# Patient Record
Sex: Female | Born: 2000
Health system: Southern US, Community
[De-identification: ages and names within clinical notes are randomized; demographics above are authoritative.]

## PROBLEM LIST (undated history)

## (undated) DIAGNOSIS — C801 Malignant (primary) neoplasm, unspecified: Secondary | ICD-10-CM

## (undated) DIAGNOSIS — F32A Depression, unspecified: Secondary | ICD-10-CM

## (undated) DIAGNOSIS — F419 Anxiety disorder, unspecified: Secondary | ICD-10-CM

## (undated) DIAGNOSIS — T7840XA Allergy, unspecified, initial encounter: Secondary | ICD-10-CM

## (undated) DIAGNOSIS — K219 Gastro-esophageal reflux disease without esophagitis: Secondary | ICD-10-CM

## (undated) HISTORY — PX: TONSILLECTOMY: SUR1361

## (undated) HISTORY — PX: WISDOM TOOTH EXTRACTION: SHX21

## (undated) HISTORY — DX: Allergy, unspecified, initial encounter: T78.40XA

---
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
## 2018-01-17 DIAGNOSIS — R062 Wheezing: Secondary | ICD-10-CM | POA: Diagnosis not present

## 2018-01-17 DIAGNOSIS — L501 Idiopathic urticaria: Secondary | ICD-10-CM | POA: Diagnosis not present

## 2018-01-17 DIAGNOSIS — J3089 Other allergic rhinitis: Secondary | ICD-10-CM | POA: Diagnosis not present

## 2018-01-17 DIAGNOSIS — J301 Allergic rhinitis due to pollen: Secondary | ICD-10-CM | POA: Diagnosis not present

## 2018-01-24 DIAGNOSIS — J3081 Allergic rhinitis due to animal (cat) (dog) hair and dander: Secondary | ICD-10-CM | POA: Diagnosis not present

## 2018-01-24 DIAGNOSIS — J301 Allergic rhinitis due to pollen: Secondary | ICD-10-CM | POA: Diagnosis not present

## 2018-01-25 DIAGNOSIS — J3089 Other allergic rhinitis: Secondary | ICD-10-CM | POA: Diagnosis not present

## 2018-02-13 DIAGNOSIS — J3089 Other allergic rhinitis: Secondary | ICD-10-CM | POA: Diagnosis not present

## 2018-02-13 DIAGNOSIS — J3081 Allergic rhinitis due to animal (cat) (dog) hair and dander: Secondary | ICD-10-CM | POA: Diagnosis not present

## 2018-02-13 DIAGNOSIS — J301 Allergic rhinitis due to pollen: Secondary | ICD-10-CM | POA: Diagnosis not present

## 2018-02-15 DIAGNOSIS — J3081 Allergic rhinitis due to animal (cat) (dog) hair and dander: Secondary | ICD-10-CM | POA: Diagnosis not present

## 2018-02-15 DIAGNOSIS — J3089 Other allergic rhinitis: Secondary | ICD-10-CM | POA: Diagnosis not present

## 2018-02-15 DIAGNOSIS — J301 Allergic rhinitis due to pollen: Secondary | ICD-10-CM | POA: Diagnosis not present

## 2018-03-20 DIAGNOSIS — J3089 Other allergic rhinitis: Secondary | ICD-10-CM | POA: Diagnosis not present

## 2018-03-20 DIAGNOSIS — Z91010 Allergy to peanuts: Secondary | ICD-10-CM | POA: Diagnosis not present

## 2018-03-20 DIAGNOSIS — J301 Allergic rhinitis due to pollen: Secondary | ICD-10-CM | POA: Diagnosis not present

## 2018-03-20 DIAGNOSIS — J3081 Allergic rhinitis due to animal (cat) (dog) hair and dander: Secondary | ICD-10-CM | POA: Diagnosis not present

## 2018-03-21 DIAGNOSIS — J301 Allergic rhinitis due to pollen: Secondary | ICD-10-CM | POA: Diagnosis not present

## 2018-03-21 DIAGNOSIS — J3089 Other allergic rhinitis: Secondary | ICD-10-CM | POA: Diagnosis not present

## 2018-03-21 DIAGNOSIS — J3081 Allergic rhinitis due to animal (cat) (dog) hair and dander: Secondary | ICD-10-CM | POA: Diagnosis not present

## 2018-04-12 DIAGNOSIS — J3089 Other allergic rhinitis: Secondary | ICD-10-CM | POA: Diagnosis not present

## 2018-04-12 DIAGNOSIS — J3081 Allergic rhinitis due to animal (cat) (dog) hair and dander: Secondary | ICD-10-CM | POA: Diagnosis not present

## 2018-04-12 DIAGNOSIS — J301 Allergic rhinitis due to pollen: Secondary | ICD-10-CM | POA: Diagnosis not present

## 2018-04-17 DIAGNOSIS — J301 Allergic rhinitis due to pollen: Secondary | ICD-10-CM | POA: Diagnosis not present

## 2018-04-17 DIAGNOSIS — J3081 Allergic rhinitis due to animal (cat) (dog) hair and dander: Secondary | ICD-10-CM | POA: Diagnosis not present

## 2018-04-17 DIAGNOSIS — J3089 Other allergic rhinitis: Secondary | ICD-10-CM | POA: Diagnosis not present

## 2018-04-19 DIAGNOSIS — J3081 Allergic rhinitis due to animal (cat) (dog) hair and dander: Secondary | ICD-10-CM | POA: Diagnosis not present

## 2018-04-19 DIAGNOSIS — J3089 Other allergic rhinitis: Secondary | ICD-10-CM | POA: Diagnosis not present

## 2018-04-19 DIAGNOSIS — J301 Allergic rhinitis due to pollen: Secondary | ICD-10-CM | POA: Diagnosis not present

## 2018-04-20 DIAGNOSIS — Z8619 Personal history of other infectious and parasitic diseases: Secondary | ICD-10-CM | POA: Diagnosis not present

## 2018-04-20 DIAGNOSIS — Z30015 Encounter for initial prescription of vaginal ring hormonal contraceptive: Secondary | ICD-10-CM | POA: Diagnosis not present

## 2018-04-20 DIAGNOSIS — N920 Excessive and frequent menstruation with regular cycle: Secondary | ICD-10-CM | POA: Diagnosis not present

## 2018-04-20 DIAGNOSIS — Z113 Encounter for screening for infections with a predominantly sexual mode of transmission: Secondary | ICD-10-CM | POA: Diagnosis not present

## 2018-04-26 DIAGNOSIS — J3081 Allergic rhinitis due to animal (cat) (dog) hair and dander: Secondary | ICD-10-CM | POA: Diagnosis not present

## 2018-04-26 DIAGNOSIS — J301 Allergic rhinitis due to pollen: Secondary | ICD-10-CM | POA: Diagnosis not present

## 2018-04-26 DIAGNOSIS — J3089 Other allergic rhinitis: Secondary | ICD-10-CM | POA: Diagnosis not present

## 2018-05-03 DIAGNOSIS — J301 Allergic rhinitis due to pollen: Secondary | ICD-10-CM | POA: Diagnosis not present

## 2018-05-03 DIAGNOSIS — J3089 Other allergic rhinitis: Secondary | ICD-10-CM | POA: Diagnosis not present

## 2018-05-03 DIAGNOSIS — J3081 Allergic rhinitis due to animal (cat) (dog) hair and dander: Secondary | ICD-10-CM | POA: Diagnosis not present

## 2018-05-10 DIAGNOSIS — J3089 Other allergic rhinitis: Secondary | ICD-10-CM | POA: Diagnosis not present

## 2018-05-10 DIAGNOSIS — J301 Allergic rhinitis due to pollen: Secondary | ICD-10-CM | POA: Diagnosis not present

## 2018-05-10 DIAGNOSIS — J3081 Allergic rhinitis due to animal (cat) (dog) hair and dander: Secondary | ICD-10-CM | POA: Diagnosis not present

## 2018-05-22 DIAGNOSIS — T7801XD Anaphylactic reaction due to peanuts, subsequent encounter: Secondary | ICD-10-CM | POA: Diagnosis not present

## 2018-05-22 DIAGNOSIS — J3089 Other allergic rhinitis: Secondary | ICD-10-CM | POA: Diagnosis not present

## 2018-05-22 DIAGNOSIS — J301 Allergic rhinitis due to pollen: Secondary | ICD-10-CM | POA: Diagnosis not present

## 2018-05-22 DIAGNOSIS — T7805XD Anaphylactic reaction due to tree nuts and seeds, subsequent encounter: Secondary | ICD-10-CM | POA: Diagnosis not present

## 2018-05-22 DIAGNOSIS — R05 Cough: Secondary | ICD-10-CM | POA: Diagnosis not present

## 2018-05-29 DIAGNOSIS — J301 Allergic rhinitis due to pollen: Secondary | ICD-10-CM | POA: Diagnosis not present

## 2018-05-29 DIAGNOSIS — J3089 Other allergic rhinitis: Secondary | ICD-10-CM | POA: Diagnosis not present

## 2018-05-29 DIAGNOSIS — J3081 Allergic rhinitis due to animal (cat) (dog) hair and dander: Secondary | ICD-10-CM | POA: Diagnosis not present

## 2018-05-31 DIAGNOSIS — R112 Nausea with vomiting, unspecified: Secondary | ICD-10-CM | POA: Diagnosis not present

## 2018-05-31 DIAGNOSIS — J069 Acute upper respiratory infection, unspecified: Secondary | ICD-10-CM | POA: Diagnosis not present

## 2018-06-22 DIAGNOSIS — N644 Mastodynia: Secondary | ICD-10-CM | POA: Diagnosis not present

## 2018-06-22 DIAGNOSIS — N6452 Nipple discharge: Secondary | ICD-10-CM | POA: Diagnosis not present

## 2018-07-02 DIAGNOSIS — R112 Nausea with vomiting, unspecified: Secondary | ICD-10-CM | POA: Diagnosis not present

## 2018-07-02 DIAGNOSIS — N6452 Nipple discharge: Secondary | ICD-10-CM | POA: Diagnosis not present

## 2018-07-02 DIAGNOSIS — R634 Abnormal weight loss: Secondary | ICD-10-CM | POA: Diagnosis not present

## 2018-07-03 ENCOUNTER — Other Ambulatory Visit: Payer: Self-pay | Admitting: Physician Assistant

## 2018-07-03 DIAGNOSIS — R110 Nausea: Secondary | ICD-10-CM

## 2018-07-03 DIAGNOSIS — R112 Nausea with vomiting, unspecified: Secondary | ICD-10-CM

## 2018-07-04 ENCOUNTER — Other Ambulatory Visit: Payer: Self-pay

## 2018-07-05 ENCOUNTER — Ambulatory Visit
Admission: RE | Admit: 2018-07-05 | Discharge: 2018-07-05 | Disposition: A | Discharge status: Home / Self Care | Payer: BLUE CROSS/BLUE SHIELD | Source: Ambulatory Visit | Attending: Physician Assistant | Admitting: Physician Assistant

## 2018-07-05 DIAGNOSIS — R112 Nausea with vomiting, unspecified: Secondary | ICD-10-CM

## 2018-07-05 DIAGNOSIS — R110 Nausea: Secondary | ICD-10-CM

## 2018-07-17 DIAGNOSIS — N6452 Nipple discharge: Secondary | ICD-10-CM | POA: Diagnosis not present

## 2018-07-17 DIAGNOSIS — N644 Mastodynia: Secondary | ICD-10-CM | POA: Diagnosis not present

## 2018-10-10 DIAGNOSIS — Z113 Encounter for screening for infections with a predominantly sexual mode of transmission: Secondary | ICD-10-CM | POA: Diagnosis not present

## 2018-10-10 DIAGNOSIS — Z114 Encounter for screening for human immunodeficiency virus [HIV]: Secondary | ICD-10-CM | POA: Diagnosis not present

## 2018-12-20 DIAGNOSIS — J301 Allergic rhinitis due to pollen: Secondary | ICD-10-CM | POA: Diagnosis not present

## 2018-12-20 DIAGNOSIS — J3081 Allergic rhinitis due to animal (cat) (dog) hair and dander: Secondary | ICD-10-CM | POA: Diagnosis not present

## 2018-12-20 DIAGNOSIS — Z91010 Allergy to peanuts: Secondary | ICD-10-CM | POA: Diagnosis not present

## 2018-12-20 DIAGNOSIS — J3089 Other allergic rhinitis: Secondary | ICD-10-CM | POA: Diagnosis not present

## 2018-12-27 DIAGNOSIS — F430 Acute stress reaction: Secondary | ICD-10-CM | POA: Diagnosis not present

## 2020-02-06 ENCOUNTER — Encounter: Payer: Self-pay | Admitting: Emergency Medicine

## 2020-02-06 ENCOUNTER — Emergency Department
Admission: EM | Admit: 2020-02-06 | Discharge: 2020-02-06 | Disposition: A | Discharge status: Home / Self Care | Payer: BC Managed Care – PPO | Attending: Student | Admitting: Student

## 2020-02-06 ENCOUNTER — Other Ambulatory Visit: Payer: Self-pay

## 2020-02-06 ENCOUNTER — Emergency Department: Payer: BC Managed Care – PPO

## 2020-02-06 DIAGNOSIS — S3992XA Unspecified injury of lower back, initial encounter: Secondary | ICD-10-CM | POA: Diagnosis not present

## 2020-02-06 DIAGNOSIS — R519 Headache, unspecified: Secondary | ICD-10-CM | POA: Diagnosis not present

## 2020-02-06 DIAGNOSIS — R0789 Other chest pain: Secondary | ICD-10-CM | POA: Diagnosis not present

## 2020-02-06 DIAGNOSIS — Z79899 Other long term (current) drug therapy: Secondary | ICD-10-CM | POA: Diagnosis not present

## 2020-02-06 DIAGNOSIS — R1011 Right upper quadrant pain: Secondary | ICD-10-CM | POA: Diagnosis not present

## 2020-02-06 DIAGNOSIS — R109 Unspecified abdominal pain: Secondary | ICD-10-CM | POA: Diagnosis not present

## 2020-02-06 DIAGNOSIS — S299XXA Unspecified injury of thorax, initial encounter: Secondary | ICD-10-CM | POA: Diagnosis not present

## 2020-02-06 DIAGNOSIS — R1031 Right lower quadrant pain: Secondary | ICD-10-CM | POA: Diagnosis not present

## 2020-02-06 DIAGNOSIS — S199XXA Unspecified injury of neck, initial encounter: Secondary | ICD-10-CM | POA: Diagnosis not present

## 2020-02-06 DIAGNOSIS — S3991XA Unspecified injury of abdomen, initial encounter: Secondary | ICD-10-CM | POA: Diagnosis not present

## 2020-02-06 DIAGNOSIS — R110 Nausea: Secondary | ICD-10-CM | POA: Diagnosis not present

## 2020-02-06 DIAGNOSIS — S0990XA Unspecified injury of head, initial encounter: Secondary | ICD-10-CM | POA: Diagnosis not present

## 2020-02-06 DIAGNOSIS — M546 Pain in thoracic spine: Secondary | ICD-10-CM | POA: Diagnosis not present

## 2020-02-06 LAB — URINALYSIS, COMPLETE (UACMP) WITH MICROSCOPIC
Bacteria, UA: NONE SEEN
Bilirubin Urine: NEGATIVE
Glucose, UA: NEGATIVE mg/dL
Hgb urine dipstick: NEGATIVE
Ketones, ur: 5 mg/dL — AB
Leukocytes,Ua: NEGATIVE
Nitrite: NEGATIVE
Protein, ur: NEGATIVE mg/dL
Specific Gravity, Urine: 1.025 (ref 1.005–1.030)
pH: 6 (ref 5.0–8.0)

## 2020-02-06 LAB — COMPREHENSIVE METABOLIC PANEL
ALT: 13 U/L (ref 0–44)
AST: 16 U/L (ref 15–41)
Albumin: 4.3 g/dL (ref 3.5–5.0)
Alkaline Phosphatase: 47 U/L (ref 38–126)
Anion gap: 7 (ref 5–15)
BUN: 13 mg/dL (ref 6–20)
CO2: 25 mmol/L (ref 22–32)
Calcium: 9.1 mg/dL (ref 8.9–10.3)
Chloride: 104 mmol/L (ref 98–111)
Creatinine, Ser: 0.71 mg/dL (ref 0.44–1.00)
GFR calc Af Amer: >60 mL/min (ref >60)
GFR calc non Af Amer: >60 mL/min (ref >60)
Glucose, Bld: 99 mg/dL (ref 70–99)
Potassium: 4.1 mmol/L (ref 3.5–5.1)
Sodium: 136 mmol/L (ref 135–145)
Total Bilirubin: 1 mg/dL (ref 0.3–1.2)
Total Protein: 7.6 g/dL (ref 6.5–8.1)

## 2020-02-06 LAB — CBC WITH DIFFERENTIAL/PLATELET
Abs Immature Granulocytes: 0.01 10*3/uL (ref 0.00–0.07)
Basophils Absolute: 0 10*3/uL (ref 0.0–0.1)
Basophils Relative: 0 %
Eosinophils Absolute: 0.2 10*3/uL (ref 0.0–0.5)
Eosinophils Relative: 5 %
HCT: 42.3 % (ref 36.0–46.0)
Hemoglobin: 14.1 g/dL (ref 12.0–15.0)
Immature Granulocytes: 0 %
Lymphocytes Relative: 57 %
Lymphs Abs: 2.6 10*3/uL (ref 0.7–4.0)
MCH: 32.9 pg (ref 26.0–34.0)
MCHC: 33.3 g/dL (ref 30.0–36.0)
MCV: 98.8 fL (ref 80.0–100.0)
Monocytes Absolute: 0.4 10*3/uL (ref 0.1–1.0)
Monocytes Relative: 8 %
Neutro Abs: 1.4 10*3/uL — ABNORMAL LOW (ref 1.7–7.7)
Neutrophils Relative %: 30 %
Platelets: 203 10*3/uL (ref 150–400)
RBC: 4.28 MIL/uL (ref 3.87–5.11)
RDW: 12.5 % (ref 11.5–15.5)
WBC: 4.7 10*3/uL (ref 4.0–10.5)
nRBC: 0 % (ref 0.0–0.2)

## 2020-02-06 LAB — TYPE AND SCREEN
ABO/RH(D): O NEG
Antibody Screen: NEGATIVE

## 2020-02-06 LAB — PREGNANCY, URINE: Preg Test, Ur: NEGATIVE
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
## 2020-02-06 MED ORDER — KETOROLAC TROMETHAMINE 30 MG/ML IJ SOLN
15.0000 mg | Freq: Once | INTRAMUSCULAR | Status: AC
  Administered 2020-02-06: 15 mg via INTRAVENOUS
  Filled 2020-02-06: qty 1

## 2020-02-06 MED ORDER — IOHEXOL 300 MG/ML  SOLN
100.0000 mL | Freq: Once | INTRAMUSCULAR | Status: AC | PRN
  Administered 2020-02-06: 100 mL via INTRAVENOUS

## 2020-02-06 MED ORDER — ONDANSETRON HCL 4 MG/2ML IJ SOLN
4.0000 mg | Freq: Once | INTRAMUSCULAR | Status: AC
  Administered 2020-02-06: 4 mg via INTRAVENOUS
  Filled 2020-02-06: qty 2

## 2020-02-06 MED ORDER — MORPHINE SULFATE (PF) 2 MG/ML IV SOLN
2.0000 mg | Freq: Once | INTRAVENOUS | Status: AC
  Administered 2020-02-06: 2 mg via INTRAVENOUS
  Filled 2020-02-06: qty 1

## 2020-02-06 NOTE — Discharge Instructions (Addendum)
Thank you for letting us take care of you in the emergency department today.  Your imaging did not show any fractures or injuries.  You can expect to be stiff and sore for the next several days.  Take over-the-counter ibuprofen and Tylenol as directed on the box to help with this.  Otherwise, please follow-up with your regular doctor to review your ER visit and follow-up on your symptoms.  Return to the emergency department for any new or worsening symptoms.

## 2020-02-06 NOTE — ED Notes (Signed)
Pt provided verbal consent for DC, topaz pad broken.

## 2020-02-06 NOTE — ED Notes (Signed)
Pt c/o pain 8/10 in her head and abdomen. EDP aware.
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
## 2020-02-15 DIAGNOSIS — Z20828 Contact with and (suspected) exposure to other viral communicable diseases: Secondary | ICD-10-CM | POA: Diagnosis not present

## 2020-02-15 DIAGNOSIS — R430 Anosmia: Secondary | ICD-10-CM | POA: Diagnosis not present

## 2020-02-15 DIAGNOSIS — R432 Parageusia: Secondary | ICD-10-CM | POA: Diagnosis not present

## 2020-02-15 DIAGNOSIS — J309 Allergic rhinitis, unspecified: Secondary | ICD-10-CM | POA: Diagnosis not present

## 2020-02-18 ENCOUNTER — Ambulatory Visit: Payer: BC Managed Care – PPO | Admitting: Family Medicine

## 2020-02-18 ENCOUNTER — Other Ambulatory Visit: Payer: Self-pay

## 2020-02-18 ENCOUNTER — Ambulatory Visit (INDEPENDENT_AMBULATORY_CARE_PROVIDER_SITE_OTHER): Payer: BC Managed Care – PPO | Admitting: Family Medicine

## 2020-02-18 ENCOUNTER — Encounter: Payer: Self-pay | Admitting: Family Medicine

## 2020-02-18 ENCOUNTER — Telehealth: Payer: Self-pay

## 2020-02-18 VITALS — BP 110/74 | HR 93 | Temp 98.2°F | Ht 62.0 in | Wt 146.0 lb

## 2020-02-18 DIAGNOSIS — J3089 Other allergic rhinitis: Secondary | ICD-10-CM

## 2020-02-18 DIAGNOSIS — J309 Allergic rhinitis, unspecified: Secondary | ICD-10-CM | POA: Insufficient documentation

## 2020-02-18 DIAGNOSIS — Z7689 Persons encountering health services in other specified circumstances: Secondary | ICD-10-CM | POA: Diagnosis not present

## 2020-02-18 DIAGNOSIS — R109 Unspecified abdominal pain: Secondary | ICD-10-CM

## 2020-02-18 MED ORDER — ALBUTEROL SULFATE HFA 108 (90 BASE) MCG/ACT IN AERS
2.0000 | INHALATION_SPRAY | Freq: Four times a day (QID) | RESPIRATORY_TRACT | 0 refills | Status: DC | PRN
Start: 2020-02-18 — End: 2020-03-22

## 2020-02-18 NOTE — Assessment & Plan Note (Signed)
With acute exacerbation, pt opting to take no medications for control. Discussed sinus rinses, essential oils, supplements/vitamins. Is agreeable to albuterol inhaler for prn use for cough, occasional chest tightness
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
## 2020-02-18 NOTE — Telephone Encounter (Signed)
PA for the Albuterol inhaler initiated and submitted via Cover My Meds. Key: [id number]

## 2020-02-21 NOTE — Telephone Encounter (Signed)
PA denied.

## 2020-02-26 DIAGNOSIS — J3081 Allergic rhinitis due to animal (cat) (dog) hair and dander: Secondary | ICD-10-CM | POA: Diagnosis not present

## 2020-02-26 DIAGNOSIS — J3089 Other allergic rhinitis: Secondary | ICD-10-CM | POA: Diagnosis not present

## 2020-02-26 DIAGNOSIS — Z91010 Allergy to peanuts: Secondary | ICD-10-CM | POA: Diagnosis not present

## 2020-02-26 DIAGNOSIS — J301 Allergic rhinitis due to pollen: Secondary | ICD-10-CM | POA: Diagnosis not present

## 2020-03-11 ENCOUNTER — Telehealth (INDEPENDENT_AMBULATORY_CARE_PROVIDER_SITE_OTHER): Payer: BC Managed Care – PPO | Admitting: Family Medicine

## 2020-03-11 ENCOUNTER — Encounter: Payer: Self-pay | Admitting: Family Medicine

## 2020-03-11 VITALS — BP 118/79 | HR 96 | Wt 140.0 lb

## 2020-03-11 DIAGNOSIS — J3089 Other allergic rhinitis: Secondary | ICD-10-CM | POA: Diagnosis not present

## 2020-03-11 NOTE — Assessment & Plan Note (Addendum)
Recommended antihistamines, flonase BID, sinus rinses, mucinex prn. If no improvement, may need prednisone taper. States she has the antihistamines and nasal spray at home from her Allergist previously and will restart them

## 2020-03-11 NOTE — Progress Notes (Signed)
BP 118/79    Pulse 96    Wt 140 lb (63.5 kg)    BMI 25.61 kg/m    Subjective:    Patient ID: Lauren Cook, female    DOB: [birth year], 19 y.o.   MRN: [id number]  HPI: Lauren Cook is a 19 y.o. female  Chief Complaint  Patient presents with   Sinusitis    pt states that she has not gotten any better since her last visit on 02/18/20   Cough   Epistaxis     This visit was completed via MyChart due to the restrictions of the COVID-19 pandemic. All issues as above were discussed and addressed. Physical exam was done as above through visual confirmation on MyChart. If it was felt that the patient should be evaluated in the office, they were directed there. The patient verbally consented to this visit.  Location of the patient: home  Location of the provider: work  Those involved with this call:   Provider: Roosvelt Maser, PA-C  CMA: Elton Sin, CMA  Front Desk/Registration: Harriet Pho   Time spent on call: 15 minutes with patient face to face via video conference. More than 50% of this time was spent in counseling and coordination of care. 5 minutes total spent in review of patient's record and preparation of their chart. I verified patient identity using two factors (patient name and date of birth). Patient consents verbally to being seen via telemedicine visit today.   Here today with ongoing sinus pressure, congestion, nosebleeds, sore swollen throat, and now productive cough. This has been ongoing for about a month now. Known hx of severe seasonal allergies but is trying to treat them naturally with essential oils, saline rinses rather than take medications. Denies fever, chills, CP, SOB. Does have an albuterol inhaler which she's taking prn with some relief of cough.   Relevant past medical, surgical, family and social history reviewed and updated as indicated. Interim medical history since our last visit reviewed. Allergies and medications reviewed and  updated.  Review of Systems  Per HPI unless specifically indicated above     Objective:    BP 118/79    Pulse 96    Wt 140 lb (63.5 kg)    BMI 25.61 kg/m   Wt Readings from Last 3 Encounters:  03/11/20 140 lb (63.5 kg)  02/18/20 146 lb (66.2 kg)  02/06/20 171 lb 8.3 oz (77.8 kg)    Physical Exam Vitals and nursing note reviewed.  Constitutional:      General: She is not in acute distress.    Appearance: Normal appearance.  HENT:     Head: Atraumatic.     Right Ear: External ear normal.     Left Ear: External ear normal.     Nose: Congestion present.     Mouth/Throat:     Mouth: Mucous membranes are moist.     Pharynx: Oropharynx is clear. Posterior oropharyngeal erythema present.  Eyes:     Extraocular Movements: Extraocular movements intact.     Conjunctiva/sclera: Conjunctivae normal.  Cardiovascular:     Comments: Unable to assess via virtual visit Pulmonary:     Effort: Pulmonary effort is normal. No respiratory distress.  Musculoskeletal:        General: Normal range of motion.     Cervical back: Normal range of motion.  Skin:    General: Skin is dry.     Findings: No erythema.  Neurological:     Mental Status: She is  alert and oriented to person, place, and time.  Psychiatric:        Mood and Affect: Mood normal.        Thought Content: Thought content normal.        Judgment: Judgment normal.     Results for orders placed or performed during the hospital encounter of 02/06/20  CBC with Differential  Result Value Ref Range   WBC 4.7 4.0 - 10.5 K/uL   RBC 4.28 3.87 - 5.11 MIL/uL   Hemoglobin 14.1 12.0 - 15.0 g/dL   HCT 61.9 36 - 46 %   MCV 98.8 80.0 - 100.0 fL   MCH 32.9 26.0 - 34.0 pg   MCHC 33.3 30.0 - 36.0 g/dL   RDW 50.9 32.6 - 71.2 %   Platelets 203 150 - 400 K/uL   nRBC 0.0 0.0 - 0.2 %   Neutrophils Relative % 30 %   Neutro Abs 1.4 (L) 1.7 - 7.7 K/uL   Lymphocytes Relative 57 %   Lymphs Abs 2.6 0.7 - 4.0 K/uL   Monocytes Relative 8 %    Monocytes Absolute 0.4 0 - 1 K/uL   Eosinophils Relative 5 %   Eosinophils Absolute 0.2 0 - 0 K/uL   Basophils Relative 0 %   Basophils Absolute 0.0 0 - 0 K/uL   Immature Granulocytes 0 %   Abs Immature Granulocytes 0.01 0.00 - 0.07 K/uL  Comprehensive metabolic panel  Result Value Ref Range   Sodium 136 135 - 145 mmol/L   Potassium 4.1 3.5 - 5.1 mmol/L   Chloride 104 98 - 111 mmol/L   CO2 25 22 - 32 mmol/L   Glucose, Bld 99 70 - 99 mg/dL   BUN 13 6 - 20 mg/dL   Creatinine, Ser 4.58 0.44 - 1.00 mg/dL   Calcium 9.1 8.9 - 09.9 mg/dL   Total Protein 7.6 6.5 - 8.1 g/dL   Albumin 4.3 3.5 - 5.0 g/dL   AST 16 15 - 41 U/L   ALT 13 0 - 44 U/L   Alkaline Phosphatase 47 38 - 126 U/L   Total Bilirubin 1.0 0.3 - 1.2 mg/dL   GFR calc non Af Amer >60 >60 mL/min   GFR calc Af Amer >60 >60 mL/min   Anion gap 7 5 - 15  Pregnancy, urine  Result Value Ref Range   Preg Test, Ur NEGATIVE NEGATIVE  Urinalysis, Complete w Microscopic  Result Value Ref Range   Color, Urine YELLOW (A) YELLOW   APPearance CLEAR (A) CLEAR   Specific Gravity, Urine 1.025 1.005 - 1.030   pH 6.0 5.0 - 8.0   Glucose, UA NEGATIVE NEGATIVE mg/dL   Hgb urine dipstick NEGATIVE NEGATIVE   Bilirubin Urine NEGATIVE NEGATIVE   Ketones, ur 5 (A) NEGATIVE mg/dL   Protein, ur NEGATIVE NEGATIVE mg/dL   Nitrite NEGATIVE NEGATIVE   Leukocytes,Ua NEGATIVE NEGATIVE   RBC / HPF 0-5 0 - 5 RBC/hpf   WBC, UA 0-5 0 - 5 WBC/hpf   Bacteria, UA NONE SEEN NONE SEEN   Squamous Epithelial / LPF 0-5 0 - 5   Mucus PRESENT   Type and screen [facility]  Result Value Ref Range   ABO/RH(D) O NEG    Antibody Screen NEG    Sample Expiration      02/09/2020,2359 Performed at [facility], [address] [place], [place] [address]       Assessment & Plan:   Problem List Items Addressed This  Visit      Respiratory   Allergic rhinitis - Primary    Recommended antihistamines, flonase BID, sinus rinses,  mucinex prn. If no improvement, may need prednisone taper. States she has the antihistamines and nasal spray at home from her Allergist previously and will restart them          Follow up plan: Return if symptoms worsen or fail to improve.

## 2020-03-14 DIAGNOSIS — J0100 Acute maxillary sinusitis, unspecified: Secondary | ICD-10-CM | POA: Diagnosis not present

## 2020-03-18 DIAGNOSIS — N898 Other specified noninflammatory disorders of vagina: Secondary | ICD-10-CM | POA: Diagnosis not present

## 2020-03-22 ENCOUNTER — Other Ambulatory Visit: Payer: Self-pay | Admitting: Family Medicine

## 2020-03-22 NOTE — Telephone Encounter (Signed)
Requested Prescriptions  Pending Prescriptions Disp Refills   VENTOLIN HFA 108 (90 Base) MCG/ACT inhaler [Pharmacy Med Name: VENTOLIN HFA 90 MCG INHALER] 18 g 0    Sig: TAKE 2 PUFFS BY MOUTH EVERY 6 HOURS AS NEEDED FOR WHEEZE OR SHORTNESS OF BREATH     Pulmonology:  Beta Agonists Failed - 03/22/2020 12:57 AM      Failed - One inhaler should last at least one month. If the patient is requesting refills earlier, contact the patient to check for uncontrolled symptoms.      Passed - Valid encounter within last 12 months    Recent Outpatient Visits          1 week ago Non-seasonal allergic rhinitis, unspecified trigger   [facility] Particia Nearing, [place]   1 month ago Non-seasonal allergic rhinitis, unspecified trigger   [facility] Roosvelt Maser [place], [place]

## 2020-04-08 ENCOUNTER — Other Ambulatory Visit: Payer: Self-pay

## 2020-04-08 ENCOUNTER — Ambulatory Visit: Payer: BC Managed Care – PPO | Admitting: Family Medicine

## 2020-04-08 ENCOUNTER — Encounter: Payer: Self-pay | Admitting: Family Medicine

## 2020-04-08 VITALS — BP 118/83 | HR 111 | Temp 99.5°F | Wt 133.0 lb

## 2020-04-08 DIAGNOSIS — J029 Acute pharyngitis, unspecified: Secondary | ICD-10-CM

## 2020-04-08 MED ORDER — AMOXICILLIN-POT CLAVULANATE 875-125 MG PO TABS: 1.0000 | ORAL_TABLET | Freq: Two times a day (BID) | ORAL | 0 refills | Status: DC

## 2020-04-08 NOTE — Progress Notes (Signed)
BP 118/83   Pulse (!) 111   Temp 99.5 F (37.5 C) (Oral)   Wt 133 lb (60.3 kg)   SpO2 97%   BMI 24.33 kg/m    Subjective:    Patient ID: Lauren Cook, female    DOB: Feb 09, 2001, 19 y.o.   MRN: [id number]  HPI: Lauren Cook is a 19 y.o. female  Chief Complaint  Patient presents with  . Ear Pain    bilateral since this morning  . Throat pain  . Headache   Here today with 1 day of swollen, painful sore throat worst on the right, hoarseness, headache, b/l ear pain, nausea. Denies congestion, rhinorrhea, cough, wheezing, fever, chills, SOB. No sick contacts. Notes she's been taking some old leftover amoxil the past 2 days without much benefit. Hx of allergic rhinitis, states currently good control without sxs off medications.   Relevant past medical, surgical, family and social history reviewed and updated as indicated. Interim medical history since our last visit reviewed. Allergies and medications reviewed and updated.  Review of Systems  Per HPI unless specifically indicated above     Objective:    BP 118/83   Pulse (!) 111   Temp 99.5 F (37.5 C) (Oral)   Wt 133 lb (60.3 kg)   SpO2 97%   BMI 24.33 kg/m   Wt Readings from Last 3 Encounters:  04/08/20 133 lb (60.3 kg)  03/11/20 140 lb (63.5 kg)  02/18/20 146 lb (66.2 kg)    Physical Exam Vitals and nursing note reviewed.  Constitutional:      Appearance: Normal appearance. She is not ill-appearing.  HENT:     Head: Atraumatic.     Right Ear: Tympanic membrane normal.     Left Ear: Tympanic membrane normal.     Nose: Nose normal.     Mouth/Throat:     Mouth: Mucous membranes are moist.     Pharynx: Oropharyngeal exudate (right greater than left tonsil) and posterior oropharyngeal erythema present.  Eyes:     Extraocular Movements: Extraocular movements intact.     Conjunctiva/sclera: Conjunctivae normal.  Cardiovascular:     Rate and Rhythm: Normal rate and regular rhythm.     Heart sounds: Normal  heart sounds.  Pulmonary:     Effort: Pulmonary effort is normal.     Breath sounds: Normal breath sounds.  Abdominal:     General: Bowel sounds are normal.     Palpations: Abdomen is soft.     Tenderness: There is no abdominal tenderness.  Musculoskeletal:        General: Normal range of motion.     Cervical back: Normal range of motion and neck supple.  Lymphadenopathy:     Cervical: Cervical adenopathy (right focal) present.  Skin:    General: Skin is warm and dry.  Neurological:     Mental Status: She is alert and oriented to person, place, and time.  Psychiatric:        Mood and Affect: Mood normal.        Thought Content: Thought content normal.        Judgment: Judgment normal.     Results for orders placed or performed during the hospital encounter of 02/06/20  CBC with Differential  Result Value Ref Range   WBC 4.7 4.0 - 10.5 K/uL   RBC 4.28 3.87 - 5.11 MIL/uL   Hemoglobin 14.1 12.0 - 15.0 g/dL   HCT 56.4 36 - 46 %   MCV 98.8  80.0 - 100.0 fL   MCH 32.9 26.0 - 34.0 pg   MCHC 33.3 30.0 - 36.0 g/dL   RDW 38.2 50.5 - 39.7 %   Platelets 203 150 - 400 K/uL   nRBC 0.0 0.0 - 0.2 %   Neutrophils Relative % 30 %   Neutro Abs 1.4 (L) 1.7 - 7.7 K/uL   Lymphocytes Relative 57 %   Lymphs Abs 2.6 0.7 - 4.0 K/uL   Monocytes Relative 8 %   Monocytes Absolute 0.4 0 - 1 K/uL   Eosinophils Relative 5 %   Eosinophils Absolute 0.2 0 - 0 K/uL   Basophils Relative 0 %   Basophils Absolute 0.0 0 - 0 K/uL   Immature Granulocytes 0 %   Abs Immature Granulocytes 0.01 0.00 - 0.07 K/uL  Comprehensive metabolic panel  Result Value Ref Range   Sodium 136 135 - 145 mmol/L   Potassium 4.1 3.5 - 5.1 mmol/L   Chloride 104 98 - 111 mmol/L   CO2 25 22 - 32 mmol/L   Glucose, Bld 99 70 - 99 mg/dL   BUN 13 6 - 20 mg/dL   Creatinine, Ser 6.73 0.44 - 1.00 mg/dL   Calcium 9.1 8.9 - 41.9 mg/dL   Total Protein 7.6 6.5 - 8.1 g/dL   Albumin 4.3 3.5 - 5.0 g/dL   AST 16 15 - 41 U/L   ALT 13 0 -  44 U/L   Alkaline Phosphatase 47 38 - 126 U/L   Total Bilirubin 1.0 0.3 - 1.2 mg/dL   GFR calc non Af Amer >60 >60 mL/min   GFR calc Af Amer >60 >60 mL/min   Anion gap 7 5 - 15  Pregnancy, urine  Result Value Ref Range   Preg Test, Ur NEGATIVE NEGATIVE  Urinalysis, Complete w Microscopic  Result Value Ref Range   Color, Urine YELLOW (A) YELLOW   APPearance CLEAR (A) CLEAR   Specific Gravity, Urine 1.025 1.005 - 1.030   pH 6.0 5.0 - 8.0   Glucose, UA NEGATIVE NEGATIVE mg/dL   Hgb urine dipstick NEGATIVE NEGATIVE   Bilirubin Urine NEGATIVE NEGATIVE   Ketones, ur 5 (A) NEGATIVE mg/dL   Protein, ur NEGATIVE NEGATIVE mg/dL   Nitrite NEGATIVE NEGATIVE   Leukocytes,Ua NEGATIVE NEGATIVE   RBC / HPF 0-5 0 - 5 RBC/hpf   WBC, UA 0-5 0 - 5 WBC/hpf   Bacteria, UA NONE SEEN NONE SEEN   Squamous Epithelial / LPF 0-5 0 - 5   Mucus PRESENT   Type and screen [facility]  Result Value Ref Range   ABO/RH(D) O NEG    Antibody Screen NEG    Sample Expiration      02/09/2020,2359 Performed at [facility], [address], [place], [place] [address]       Assessment & Plan:   Problem List Items Addressed This Visit    None    Visit Diagnoses    Pharyngitis, unspecified etiology    -  Primary   Suspect bacterial, will tx with augmentin, salt water gargles and other supportive care, remain home until sxs resolve. Declines work note       Follow up plan: Return if symptoms worsen or fail to improve.

## 2020-04-14 ENCOUNTER — Encounter: Payer: Self-pay | Admitting: Family Medicine

## 2020-05-07 DIAGNOSIS — N760 Acute vaginitis: Secondary | ICD-10-CM | POA: Diagnosis not present

## 2020-05-12 ENCOUNTER — Ambulatory Visit: Payer: BC Managed Care – PPO | Admitting: Family Medicine

## 2020-05-15 ENCOUNTER — Ambulatory Visit (INDEPENDENT_AMBULATORY_CARE_PROVIDER_SITE_OTHER): Payer: BC Managed Care – PPO | Admitting: Family Medicine

## 2020-05-15 ENCOUNTER — Other Ambulatory Visit: Payer: Self-pay

## 2020-05-15 ENCOUNTER — Encounter: Payer: Self-pay | Admitting: Family Medicine

## 2020-05-15 VITALS — BP 102/68 | HR 104 | Temp 99.1°F | Ht 63.0 in | Wt 141.0 lb

## 2020-05-15 DIAGNOSIS — Z136 Encounter for screening for cardiovascular disorders: Secondary | ICD-10-CM | POA: Diagnosis not present

## 2020-05-15 DIAGNOSIS — Z0000 Encounter for general adult medical examination without abnormal findings: Secondary | ICD-10-CM | POA: Diagnosis not present

## 2020-05-15 LAB — UA/M W/RFLX CULTURE, ROUTINE
Bilirubin, UA: NEGATIVE
Glucose, UA: NEGATIVE
Ketones, UA: NEGATIVE
Leukocytes,UA: NEGATIVE
Nitrite, UA: NEGATIVE
Protein,UA: NEGATIVE
RBC, UA: NEGATIVE
Specific Gravity, UA: 1.025 (ref 1.005–1.030)
Urobilinogen, Ur: 0.2 mg/dL (ref 0.2–1.0)
pH, UA: 7 (ref 5.0–7.5)

## 2020-05-15 NOTE — Progress Notes (Signed)
BP 102/68   Pulse (!) 104   Temp 99.1 F (37.3 C) (Oral)   Ht 5\' 3"  (1.6 m)   Wt 141 lb (64 kg)   SpO2 97%   BMI 24.98 kg/m    Subjective:    Patient ID: , female    DOB: 04/10/2001, 19 y.o.   MRN: [id number]  HPI: Lauren Cook is a 19 y.o. female presenting on 05/15/2020 for comprehensive medical examination. 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 I did complete a risk assessment for falls. A plan of care for falls was documented.   Past Medical History:  Past Medical History:  Diagnosis Date  . Allergy     Surgical History:  Past Surgical History:  Procedure Laterality Date  . WISDOM TOOTH EXTRACTION Bilateral 07/2013    Medications:  No current outpatient medications on file prior to visit.   No current facility-administered medications on file prior to visit.    Allergies:  Allergies  Allergen Reactions  . Fish Allergy Hives  . Food Anaphylaxis and Other (See Comments)    Nuts  . Other Hives and Other (See Comments)    Raw vegetables  . Iodinated Diagnostic Agents Hives  . Nickel Hives  . Iodine Hives  . No Healthtouch Food Allergies Hives and Other (See Comments)    Citric Fruits  . Tramadol Nausea And Vomiting    Social History:  Social History   Socioeconomic History  . Marital status: Single    Spouse name: Not on file  . Number of children: Not on file  . Years of education: Not on file  .  Highest education level: Not on file  Occupational History  . Not on file  Tobacco Use  . Smoking status: Never Smoker  . Smokeless tobacco: Never Used  Vaping Use  . Vaping Use: Never used  Substance and Sexual Activity  . Alcohol use: No  . Drug use: No  . Sexual activity: Not Currently  Other Topics Concern  . Not on file  Social History Narrative  . Not on file   Social Determinants of Health   Financial Resource Strain:   . Difficulty of Paying Living Expenses: Not on file  Food Insecurity:   . Worried About 08/2013 in the Last Year: Not on file  . Ran Out of Food in the Last Year: Not on file  Transportation Needs:   . Lack of Transportation (Medical): Not on file  . Lack of Transportation (Non-Medical): Not on file  Physical Activity:   . Days of Exercise per Week: Not on file  . Minutes of Exercise per Session: Not on file  Stress:   . Feeling of Stress : Not on file  Social Connections:   . Frequency of Communication with Friends and Family: Not on file  . Frequency of Social Gatherings with Friends and Family: Not on  file  . Attends Religious Services: Not on file  . Active Member of Clubs or Organizations: Not on file  . Attends Banker Meetings: Not on file  . Marital Status: Not on file  Intimate Partner Violence:   . Fear of Current or Ex-Partner: Not on file  . Emotionally Abused: Not on file  . Physically Abused: Not on file  . Sexually Abused: Not on file   Social History   Tobacco Use  Smoking Status Never Smoker  Smokeless Tobacco Never Used   Social History   Substance and Sexual Activity  Alcohol Use No    Family History:  Family History  Problem Relation Age of Onset  . Breast cancer Mother     Past medical history, surgical history, medications, allergies, family history and social history reviewed with patient today and changes made to appropriate areas of the chart.   Review of Systems - General ROS:  negative Psychological ROS: negative Ophthalmic ROS: negative ENT ROS: negative Allergy and Immunology ROS: negative Hematological and Lymphatic ROS: negative Endocrine ROS: negative Breast ROS: negative for breast lumps Respiratory ROS: no cough, shortness of breath, or wheezing Cardiovascular ROS: no chest pain or dyspnea on exertion Gastrointestinal ROS: no abdominal pain, change in bowel habits, or black or bloody stools Genito-Urinary ROS: no dysuria, trouble voiding, or hematuria Musculoskeletal ROS: negative Neurological ROS: no TIA or stroke symptoms Dermatological ROS: negative All other ROS negative except what is listed above and in the HPI.      Objective:    BP 102/68   Pulse (!) 104   Temp 99.1 F (37.3 C) (Oral)   Ht 5\' 3"  (1.6 m)   Wt 141 lb (64 kg)   SpO2 97%   BMI 24.98 kg/m   Wt Readings from Last 3 Encounters:  05/15/20 141 lb (64 kg)  04/08/20 133 lb (60.3 kg)  03/11/20 140 lb (63.5 kg)    Physical Exam Vitals and nursing note reviewed.  Constitutional:      General: She is not in acute distress.    Appearance: She is well-developed.  HENT:     Head: Atraumatic.     Right Ear: External ear normal.     Left Ear: External ear normal.     Nose: Nose normal.     Mouth/Throat:     Pharynx: No oropharyngeal exudate.  Eyes:     General: No scleral icterus.    Conjunctiva/sclera: Conjunctivae normal.     Pupils: Pupils are equal, round, and reactive to light.  Neck:     Thyroid: No thyromegaly.  Cardiovascular:     Rate and Rhythm: Normal rate and regular rhythm.     Heart sounds: Normal heart sounds.  Pulmonary:     Effort: Pulmonary effort is normal. No respiratory distress.     Breath sounds: Normal breath sounds.  Chest:     Comments: Breast exam declined Abdominal:     General: Bowel sounds are normal.     Palpations: Abdomen is soft. There is no mass.     Tenderness: There is no abdominal tenderness.  Genitourinary:    Comments: GU  exam declined Musculoskeletal:        General: No tenderness. Normal range of motion.     Cervical back: Normal range of motion and neck supple.  Lymphadenopathy:     Cervical: No cervical adenopathy.  Skin:    General: Skin is warm and dry.     Findings: No rash.  Neurological:     Mental Status: She is alert and oriented to person, place, and time.     Cranial Nerves: No cranial nerve deficit.  Psychiatric:        Behavior: Behavior normal.     Results for orders placed or performed in visit on 05/15/20  CBC with Differential/Platelet  Result Value Ref Range   WBC 3.8 3.4 - 10.8 x10E3/uL   RBC 3.80 3.77 - 5.28 x10E6/uL   Hemoglobin 12.5 11.1 - 15.9 g/dL   Hematocrit 16.137.0 09.634.0 - 46.6 %   MCV 97 79 - 97 fL   MCH 32.9 26.6 - 33.0 pg   MCHC 33.8 31 - 35 g/dL   RDW 04.512.7 40.911.7 - 81.115.4 %   Platelets 216 150 - 450 x10E3/uL   Neutrophils 60 Not Estab. %   Lymphs 23 Not Estab. %   Monocytes 12 Not Estab. %   Eos 3 Not Estab. %   Basos 1 Not Estab. %   Neutrophils Absolute 2.3 1 - 7 x10E3/uL   Lymphocytes Absolute 0.9 0 - 3 x10E3/uL   Monocytes Absolute 0.5 0 - 0 x10E3/uL   EOS (ABSOLUTE) 0.1 0.0 - 0.4 x10E3/uL   Basophils Absolute 0.0 0 - 0 x10E3/uL   Immature Granulocytes 1 Not Estab. %   Immature Grans (Abs) 0.0 0.0 - 0.1 x10E3/uL  Comprehensive metabolic panel  Result Value Ref Range   Glucose 89 65 - 99 mg/dL   BUN 11 6 - 20 mg/dL   Creatinine, Ser 9.140.86 0.57 - 1.00 mg/dL   GFR calc non Af Amer 97 >59 mL/min/1.73   GFR calc Af Amer 112 >59 mL/min/1.73   BUN/Creatinine Ratio 13 9 - 23   Sodium 138 134 - 144 mmol/L   Potassium 4.2 3.5 - 5.2 mmol/L   Chloride 101 96 - 106 mmol/L   CO2 27 20 - 29 mmol/L   Calcium 9.2 8.7 - 10.2 mg/dL   Total Protein 6.6 6.0 - 8.5 g/dL   Albumin 4.0 3.9 - 5.0 g/dL   Globulin, Total 2.6 1.5 - 4.5 g/dL   Albumin/Globulin Ratio 1.5 1.2 - 2.2   Bilirubin Total 0.3 0.0 - 1.2 mg/dL   Alkaline Phosphatase 55 48 - 121 IU/L   AST 14 0 - 40 IU/L    ALT 11 0 - 32 IU/L  Lipid Panel w/o Chol/HDL Ratio  Result Value Ref Range   Cholesterol, Total 179 100 - 199 mg/dL   Triglycerides 73 0 - 149 mg/dL   HDL 69 >78>39 mg/dL   VLDL Cholesterol Cal 14 5 - 40 mg/dL   LDL Chol Calc (NIH) 96 0 - 99 mg/dL  TSH  Result Value Ref Range   TSH 0.320 (L) 0.450 - 4.500 uIU/mL  UA/M w/rflx Culture, Routine   Specimen: Urine   Urine  Result Value Ref Range   Specific Gravity, UA 1.025 1.005 - 1.030   pH, UA 7.0 5.0 - 7.5   Color, UA Yellow Yellow   Appearance Ur Clear Clear   Leukocytes,UA Negative Negative   Protein,UA Negative Negative/Trace   Glucose, UA Negative Negative   Ketones, UA Negative Negative   RBC, UA Negative Negative   Bilirubin, UA Negative Negative   Urobilinogen, Ur 0.2 0.2 - 1.0 mg/dL   Nitrite, UA Negative Negative      Assessment & Plan:   Problem List Items Addressed This Visit    None    Visit Diagnoses    Annual physical exam    -  Primary   Relevant Orders   CBC with Differential/Platelet (Completed)   Comprehensive metabolic panel (Completed)   Lipid Panel w/o Chol/HDL Ratio (Completed)   TSH (Completed)   UA/M w/rflx Culture, Routine (Completed)       Follow up plan: No follow-ups on file.   LABORATORY TESTING:  - Pap smear: up to date  IMMUNIZATIONS:   - Tdap: Tetanus vaccination status reviewed: unsure of date, will try to locate records. - Influenza: Postponed to flu season  PATIENT COUNSELING:   Advised to take 1 mg of folate supplement per day if capable of pregnancy.   Sexuality: Discussed sexually transmitted diseases, partner selection, use of condoms, avoidance of unintended pregnancy  and contraceptive alternatives.   Advised to avoid cigarette smoking.  I discussed with the patient that most people either abstain from alcohol or drink within safe limits (<=14/week and <=4 drinks/occasion for males, <=7/weeks and <= 3 drinks/occasion for females) and that the risk for alcohol  disorders and other health effects rises proportionally with the number of drinks per week and how often a drinker exceeds daily limits.  Discussed cessation/primary prevention of drug use and availability of treatment for abuse.   Diet: Encouraged to adjust caloric intake to maintain  or achieve ideal body weight, to reduce intake of dietary saturated fat and total fat, to limit sodium intake by avoiding high sodium foods and not adding table salt, and to maintain adequate dietary potassium and calcium preferably from fresh fruits, vegetables, and low-fat dairy products.    stressed the importance of regular exercise  Injury prevention: Discussed safety belts, safety helmets, smoke detector, smoking near bedding or upholstery.   Dental health: Discussed importance of regular tooth brushing, flossing, and dental visits.    NEXT PREVENTATIVE PHYSICAL DUE IN 1 YEAR. No follow-ups on file.

## 2020-05-16 ENCOUNTER — Encounter: Payer: Self-pay | Admitting: Family Medicine

## 2020-05-16 LAB — LIPID PANEL W/O CHOL/HDL RATIO
Cholesterol, Total: 179 mg/dL (ref 100–199)
HDL: 69 mg/dL (ref >39)
LDL Chol Calc (NIH): 96 mg/dL (ref 0–99)
Triglycerides: 73 mg/dL (ref 0–149)
VLDL Cholesterol Cal: 14 mg/dL (ref 5–40)

## 2020-05-16 LAB — TSH: TSH: 0.32 u[IU]/mL — ABNORMAL LOW (ref 0.450–4.500)

## 2020-05-16 LAB — CBC WITH DIFFERENTIAL/PLATELET
Basophils Absolute: 0 10*3/uL (ref 0.0–0.2)
Basos: 1 %
EOS (ABSOLUTE): 0.1 10*3/uL (ref 0.0–0.4)
Eos: 3 %
Hematocrit: 37 % (ref 34.0–46.6)
Hemoglobin: 12.5 g/dL (ref 11.1–15.9)
Immature Grans (Abs): 0 10*3/uL (ref 0.0–0.1)
Immature Granulocytes: 1 %
Lymphocytes Absolute: 0.9 10*3/uL (ref 0.7–3.1)
Lymphs: 23 %
MCH: 32.9 pg (ref 26.6–33.0)
MCHC: 33.8 g/dL (ref 31.5–35.7)
MCV: 97 fL (ref 79–97)
Monocytes Absolute: 0.5 10*3/uL (ref 0.1–0.9)
Monocytes: 12 %
Neutrophils Absolute: 2.3 10*3/uL (ref 1.4–7.0)
Neutrophils: 60 %
Platelets: 216 10*3/uL (ref 150–450)
RBC: 3.8 x10E6/uL (ref 3.77–5.28)
RDW: 12.7 % (ref 11.7–15.4)
WBC: 3.8 10*3/uL (ref 3.4–10.8)

## 2020-05-16 LAB — COMPREHENSIVE METABOLIC PANEL
ALT: 11 IU/L (ref 0–32)
AST: 14 IU/L (ref 0–40)
Albumin/Globulin Ratio: 1.5 (ref 1.2–2.2)
Albumin: 4 g/dL (ref 3.9–5.0)
Alkaline Phosphatase: 55 IU/L (ref 48–121)
BUN/Creatinine Ratio: 13 (ref 9–23)
BUN: 11 mg/dL (ref 6–20)
Bilirubin Total: 0.3 mg/dL (ref 0.0–1.2)
CO2: 27 mmol/L (ref 20–29)
Calcium: 9.2 mg/dL (ref 8.7–10.2)
Chloride: 101 mmol/L (ref 96–106)
Creatinine, Ser: 0.86 mg/dL (ref 0.57–1.00)
GFR calc Af Amer: 112 mL/min/{1.73_m2} (ref >59)
GFR calc non Af Amer: 97 mL/min/{1.73_m2} (ref >59)
Globulin, Total: 2.6 g/dL (ref 1.5–4.5)
Glucose: 89 mg/dL (ref 65–99)
Potassium: 4.2 mmol/L (ref 3.5–5.2)
Sodium: 138 mmol/L (ref 134–144)
Total Protein: 6.6 g/dL (ref 6.0–8.5)

## 2020-05-28 DIAGNOSIS — N898 Other specified noninflammatory disorders of vagina: Secondary | ICD-10-CM | POA: Diagnosis not present

## 2020-06-16 DIAGNOSIS — N898 Other specified noninflammatory disorders of vagina: Secondary | ICD-10-CM | POA: Diagnosis not present

## 2020-06-16 DIAGNOSIS — T7621XA Adult sexual abuse, suspected, initial encounter: Secondary | ICD-10-CM | POA: Diagnosis not present

## 2020-06-16 DIAGNOSIS — T7421XA Adult sexual abuse, confirmed, initial encounter: Secondary | ICD-10-CM | POA: Diagnosis not present

## 2020-07-14 DIAGNOSIS — Z3040 Encounter for surveillance of contraceptives, unspecified: Secondary | ICD-10-CM | POA: Diagnosis not present

## 2020-07-14 DIAGNOSIS — N898 Other specified noninflammatory disorders of vagina: Secondary | ICD-10-CM | POA: Diagnosis not present

## 2020-07-14 DIAGNOSIS — Z113 Encounter for screening for infections with a predominantly sexual mode of transmission: Secondary | ICD-10-CM | POA: Diagnosis not present

## 2020-07-14 DIAGNOSIS — B373 Candidiasis of vulva and vagina: Secondary | ICD-10-CM | POA: Diagnosis not present

## 2020-08-24 ENCOUNTER — Ambulatory Visit
Admission: EM | Admit: 2020-08-24 | Discharge: 2020-08-24 | Disposition: A | Discharge status: Home / Self Care | Payer: BC Managed Care – PPO | Attending: Emergency Medicine | Admitting: Emergency Medicine

## 2020-08-24 ENCOUNTER — Encounter: Payer: Self-pay | Admitting: Emergency Medicine

## 2020-08-24 ENCOUNTER — Other Ambulatory Visit: Payer: Self-pay

## 2020-08-24 DIAGNOSIS — H6692 Otitis media, unspecified, left ear: Secondary | ICD-10-CM

## 2020-08-24 DIAGNOSIS — J029 Acute pharyngitis, unspecified: Secondary | ICD-10-CM | POA: Diagnosis not present

## 2020-08-24 LAB — POCT RAPID STREP A (OFFICE): Rapid Strep A Screen: NEGATIVE

## 2020-08-24 MED ORDER — AZITHROMYCIN 250 MG PO TABS
250.0000 mg | ORAL_TABLET | Freq: Every day | ORAL | 0 refills | Status: DC
Start: 2020-08-24 — End: 2020-09-30

## 2020-08-24 NOTE — ED Triage Notes (Signed)
Patient c/o sore throat, LFT ear pain, and "inflammed tonsils".  Patient stated sore throat began on Saturday and "inflammed tonsils" began last night.   Patient denies fever , SOB, or cough.   Patient hasn't taken any medication for symptoms.

## 2020-08-24 NOTE — Discharge Instructions (Signed)
Take the antibiotic as directed.    Your rapid strep test is negative.  A throat culture is pending; we will call you if it is positive requiring treatment.    Follow up with your primary care provider if your symptoms are not improving.

## 2020-08-24 NOTE — ED Provider Notes (Signed)
Lauren Cook    CSN: [id number] Arrival date & time: 08/24/20  0859      History   Chief Complaint Chief Complaint  Patient presents with  . Sore Throat  . Otalgia    HPI Lauren Cook is a 19 y.o. female.   Patient presents with 2-day history of sore throat and ear pain.  She denies fever, rash, cough, shortness of breath, vomiting, diarrhea, or other symptoms.  No treatments attempted at home.  Her medical history includes migraines, allergies, frequent nosebleeds.  The history is provided by the patient.    Past Medical History:  Diagnosis Date  . Allergy     Patient Active Problem List   Diagnosis Date Noted  . Allergic rhinitis 02/18/2020  . Migraine without aura and without status migrainosus, not intractable 11/28/2013  . Tension headache 11/28/2013  . New daily persistent headache 11/28/2013  . Frequent nosebleeds 11/28/2013    Past Surgical History:  Procedure Laterality Date  . WISDOM TOOTH EXTRACTION Bilateral 07/2013    OB History   No obstetric history on file.      Home Medications    Prior to Admission medications   Medication Sig Start Date End Date Taking? Authorizing Provider  azithromycin (ZITHROMAX) 250 MG tablet Take 1 tablet (250 mg total) by mouth daily. Take first 2 tablets together, then 1 every day until finished. 08/24/20   Mickie Bail, NP    Family History Family History  Problem Relation Age of Onset  . Breast cancer Mother     Social History Social History   Tobacco Use  . Smoking status: Never Smoker  . Smokeless tobacco: Never Used  Vaping Use  . Vaping Use: Never used  Substance Use Topics  . Alcohol use: No  . Drug use: No     Allergies   Fish allergy, Food, Other, Iodinated diagnostic agents, Nickel, Iodine, No healthtouch food allergies, and Tramadol   Review of Systems Review of Systems  Constitutional: Negative for chills and fever.  HENT: Positive for ear pain and sore throat.     Eyes: Negative for pain and visual disturbance.  Respiratory: Negative for cough and shortness of breath.   Cardiovascular: Negative for chest pain and palpitations.  Gastrointestinal: Negative for abdominal pain and vomiting.  Genitourinary: Negative for dysuria and hematuria.  Musculoskeletal: Negative for arthralgias and back pain.  Skin: Negative for color change and rash.  Neurological: Negative for seizures and syncope.  All other systems reviewed and are negative.    Physical Exam Triage Vital Signs ED Triage Vitals  Enc Vitals Group     BP 08/24/20 0924 121/83     Pulse Rate 08/24/20 0924 96     Resp 08/24/20 0924 16     Temp 08/24/20 0924 98.1 F (36.7 C)     Temp Source 08/24/20 0924 Oral     SpO2 08/24/20 0924 98 %     Weight --      Height --      Head Circumference --      Peak Flow --      Pain Score 08/24/20 0933 4     Pain Loc --      Pain Edu? --      Excl. in GC? --    No data found.  Updated Vital Signs BP 121/83 (BP Location: Left Arm)   Pulse 96   Temp 98.1 F (36.7 C) (Oral)   Resp 16   LMP 07/31/2020  SpO2 98%   Visual Acuity Right Eye Distance:   Left Eye Distance:   Bilateral Distance:    Right Eye Near:   Left Eye Near:    Bilateral Near:     Physical Exam Vitals and nursing note reviewed.  Constitutional:      General: She is not in acute distress.    Appearance: She is well-developed.  HENT:     Head: Normocephalic and atraumatic.     Right Ear: Tympanic membrane and ear canal normal.     Left Ear: Ear canal normal. Tympanic membrane is erythematous.     Mouth/Throat:     Mouth: Mucous membranes are moist.     Pharynx: Posterior oropharyngeal erythema present.  Eyes:     Conjunctiva/sclera: Conjunctivae normal.  Cardiovascular:     Rate and Rhythm: Normal rate and regular rhythm.     Heart sounds: Normal heart sounds.  Pulmonary:     Effort: Pulmonary effort is normal. No respiratory distress.     Breath sounds:  Normal breath sounds.  Abdominal:     Palpations: Abdomen is soft.     Tenderness: There is no abdominal tenderness. There is no guarding or rebound.  Musculoskeletal:     Cervical back: Neck supple.  Skin:    General: Skin is warm and dry.     Findings: No rash.  Neurological:     General: No focal deficit present.     Mental Status: She is alert and oriented to person, place, and time.     Gait: Gait normal.  Psychiatric:        Mood and Affect: Mood normal.        Behavior: Behavior normal.      UC Treatments / Results  Labs (all labs ordered are listed, but only abnormal results are displayed) Labs Reviewed  CULTURE, GROUP A STREP [facility])  POCT RAPID STREP A (OFFICE)    EKG   Radiology No results found.  Procedures Procedures (including critical care time)  Medications Ordered in UC Medications - No data to display  Initial Impression / Assessment and Plan / UC Course  I have reviewed the triage vital signs and the nursing notes.  Pertinent labs & imaging results that were available during my care of the patient were reviewed by me and considered in my medical decision making (see chart for details).   Left otitis media, sore throat.  Rapid strep negative; culture pending.  Patient declines COVID test.  Treating with Zithromax; patient states she is not able to take amoxicillin because it causes vaginitis.  Instructed her to take ibuprofen or Tylenol as needed for discomfort.  Instructed her to follow-up with her PCP if her symptoms or not improving.  Patient agrees to plan of care.   Final Clinical Impressions(s) / UC Diagnoses   Final diagnoses:  Left otitis media, unspecified otitis media type  Sore throat     Discharge Instructions     Take the antibiotic as directed.    Your rapid strep test is negative.  A throat culture is pending; we will call you if it is positive requiring treatment.    Follow up with your primary care provider if your  symptoms are not improving.        ED Prescriptions    Medication Sig Dispense Auth. Provider   azithromycin (ZITHROMAX) 250 MG tablet Take 1 tablet (250 mg total) by mouth daily. Take first 2 tablets together, then 1 every day until finished.  6 tablet Mickie Bail, NP     PDMP not reviewed this encounter.   Mickie Bail, NP 08/24/20 [phone]

## 2020-08-25 ENCOUNTER — Telehealth (HOSPITAL_COMMUNITY): Payer: Self-pay | Admitting: Emergency Medicine

## 2020-08-25 MED ORDER — ONDANSETRON 4 MG PO TBDP
4.0000 mg | ORAL_TABLET | Freq: Three times a day (TID) | ORAL | 0 refills | Status: DC | PRN
Start: 2020-08-25 — End: 2020-09-30

## 2020-08-25 NOTE — Telephone Encounter (Signed)
Patient called stating she is having nausea and vomiting related to her prescribed antibiotic.  Reviewed with Wendee Beavers, who provided verbal for Zofran for patient.  Reviewed with patient, verified pharmacy, prescription sent

## 2020-08-27 DIAGNOSIS — N898 Other specified noninflammatory disorders of vagina: Secondary | ICD-10-CM | POA: Diagnosis not present

## 2020-08-27 DIAGNOSIS — N760 Acute vaginitis: Secondary | ICD-10-CM | POA: Diagnosis not present

## 2020-08-27 LAB — CULTURE, GROUP A STREP (THRC)

## 2020-09-30 ENCOUNTER — Ambulatory Visit
Admission: EM | Admit: 2020-09-30 | Discharge: 2020-09-30 | Disposition: A | Discharge status: Home / Self Care | Payer: BC Managed Care – PPO | Attending: Family Medicine | Admitting: Family Medicine

## 2020-09-30 ENCOUNTER — Other Ambulatory Visit: Payer: Self-pay

## 2020-09-30 DIAGNOSIS — Z113 Encounter for screening for infections with a predominantly sexual mode of transmission: Secondary | ICD-10-CM | POA: Insufficient documentation

## 2020-09-30 DIAGNOSIS — J0110 Acute frontal sinusitis, unspecified: Secondary | ICD-10-CM | POA: Insufficient documentation

## 2020-09-30 MED ORDER — PREDNISONE 10 MG PO TABS
30.0000 mg | ORAL_TABLET | Freq: Every day | ORAL | 0 refills | Status: AC
Start: 2020-09-30 — End: 2020-10-03

## 2020-09-30 NOTE — ED Triage Notes (Signed)
Pt presents with c/o ear fullness and pressure , also visualizes throat irritation but no pain, would like std screen, no symptoms

## 2020-09-30 NOTE — Discharge Instructions (Addendum)
Prednisone daily for the next 3 days.  Recommend saline nasal spray. Swab sent for testing.  We will call you with any positive results.  You can also check your MyChart for results Follow up as needed for continued or worsening symptoms

## 2020-09-30 NOTE — ED Provider Notes (Signed)
[name]    CSN: [id number] Arrival date & time: 09/30/20  0827      History   Chief Complaint Chief Complaint  Patient presents with  . Sore Throat  . Otalgia    HPI Lauren Cook is a 20 y.o. female.   Patient is a 20 year old female who presents today for bilateral ear fullness, sinus pressure, throat irritation postnasal drip.  This is been constant for the past couple days.  Had negative Covid and flu testing already.  No cough, chest congestion. She also is requesting STD screening.  Denies any associated symptoms     Past Medical History:  Diagnosis Date  . Allergy     Patient Active Problem List   Diagnosis Date Noted  . Allergic rhinitis 02/18/2020  . Migraine without aura and without status migrainosus, not intractable 11/28/2013  . Tension headache 11/28/2013  . New daily persistent headache 11/28/2013  . Frequent nosebleeds 11/28/2013    Past Surgical History:  Procedure Laterality Date  . WISDOM TOOTH EXTRACTION Bilateral 07/2013    OB History   No obstetric history on file.      Home Medications    Prior to Admission medications   Medication Sig Start Date End Date Taking? Authorizing Provider  predniSONE (DELTASONE) 10 MG tablet Take 3 tablets (30 mg total) by mouth daily for 3 days. 09/30/20 10/03/20 Yes Janace Aris, NP    Family History Family History  Problem Relation Age of Onset  . Breast cancer Mother     Social History Social History   Tobacco Use  . Smoking status: Never Smoker  . Smokeless tobacco: Never Used  Vaping Use  . Vaping Use: Never used  Substance Use Topics  . Alcohol use: No  . Drug use: No     Allergies   Fish allergy, Food, Other, Iodinated diagnostic agents, Nickel, Iodine, No healthtouch food allergies, and Tramadol   Review of Systems Review of Systems   Physical Exam Triage Vital Signs ED Triage Vitals  Enc Vitals Group     BP 09/30/20 0851 116/72     Pulse Rate 09/30/20  0851 (!) 110     Resp 09/30/20 0851 16     Temp 09/30/20 0851 97.6 F (36.4 C)     Temp Source 09/30/20 0851 Oral     SpO2 09/30/20 0851 96 %     Weight --      Height --      Head Circumference --      Peak Flow --      Pain Score 09/30/20 0853 0     Pain Loc --      Pain Edu? --      Excl. in GC? --    No data found.  Updated Vital Signs BP 116/72 (BP Location: Left Arm)   Pulse (!) 110   Temp 97.6 F (36.4 C) (Oral)   Resp 16   LMP 09/26/2020   SpO2 96%   Visual Acuity Right Eye Distance:   Left Eye Distance:   Bilateral Distance:    Right Eye Near:   Left Eye Near:    Bilateral Near:     Physical Exam Vitals and nursing note reviewed.  Constitutional:      General: She is not in acute distress.    Appearance: Normal appearance. She is not ill-appearing, toxic-appearing or diaphoretic.  HENT:     Head: Normocephalic.     Right Ear: External ear normal.  Left Ear: Tympanic membrane, ear canal and external ear normal.     Ears:     Comments: Right middle ear effusion with injection      Nose: Nose normal.     Mouth/Throat:     Pharynx: Oropharynx is clear. Posterior oropharyngeal erythema present.  Eyes:     Conjunctiva/sclera: Conjunctivae normal.  Pulmonary:     Effort: Pulmonary effort is normal.  Abdominal:     Palpations: Abdomen is soft.     Tenderness: There is no abdominal tenderness.  Musculoskeletal:        General: Normal range of motion.     Cervical back: Normal range of motion.  Skin:    General: Skin is warm and dry.     Findings: No rash.  Neurological:     Mental Status: She is alert.  Psychiatric:        Mood and Affect: Mood normal.      UC Treatments / Results  Labs (all labs ordered are listed, but only abnormal results are displayed) Labs Reviewed  CERVICOVAGINAL ANCILLARY ONLY    EKG   Radiology No results found.  Procedures Procedures (including critical care time)  Medications Ordered in  UC Medications - No data to display  Initial Impression / Assessment and Plan / UC Course  I have reviewed the triage vital signs and the nursing notes.  Pertinent labs & imaging results that were available during my care of the patient were reviewed by me and considered in my medical decision making (see chart for details).     Sinusitis with eustachian tube, right middle ear effusion.  Treat with prednisone daily for 3 days.  Recommended saline nasal spray.  STD screening  Swab pending  Final Clinical Impressions(s) / UC Diagnoses   Final diagnoses:  Acute non-recurrent frontal sinusitis  Screening for STD (sexually transmitted disease)     Discharge Instructions     Prednisone daily for the next 3 days.  Recommend saline nasal spray. Swab sent for testing.  We will call you with any positive results.  You can also check your MyChart for results Follow up as needed for continued or worsening symptoms     ED Prescriptions    Medication Sig Dispense Auth. Provider   predniSONE (DELTASONE) 10 MG tablet Take 3 tablets (30 mg total) by mouth daily for 3 days. 9 tablet Joyclyn Plazola A, NP     PDMP not reviewed this encounter.   Janace Aris, NP 09/30/20 [phone]

## 2020-10-01 LAB — CERVICOVAGINAL ANCILLARY ONLY
Bacterial Vaginitis (gardnerella): POSITIVE — AB
Candida Glabrata: NEGATIVE
Candida Vaginitis: NEGATIVE
Chlamydia: NEGATIVE
Comment: NEGATIVE
Comment: NEGATIVE
Comment: NEGATIVE
Comment: NEGATIVE
Comment: NEGATIVE
Comment: NORMAL
Neisseria Gonorrhea: NEGATIVE
Trichomonas: NEGATIVE

## 2020-10-02 ENCOUNTER — Telehealth: Payer: Self-pay | Admitting: Family Medicine

## 2020-10-02 MED ORDER — METRONIDAZOLE 0.75 % VA GEL: 1.0000 | Freq: Every day | VAGINAL | 0 refills | Status: AC

## 2020-10-02 MED ORDER — AMOXICILLIN 500 MG PO CAPS
1000.0000 mg | ORAL_CAPSULE | Freq: Three times a day (TID) | ORAL | 0 refills | Status: AC
Start: 2020-10-02 — End: 2020-10-07

## 2020-10-02 NOTE — Telephone Encounter (Signed)
Concerned for ear infection based on continued symptoms and not better with prednisone. Sending in abx for this.  She also is positive for BV. Sending in Flagyl Gel

## 2020-10-28 DIAGNOSIS — N920 Excessive and frequent menstruation with regular cycle: Secondary | ICD-10-CM | POA: Diagnosis not present

## 2020-10-28 DIAGNOSIS — N943 Premenstrual tension syndrome: Secondary | ICD-10-CM | POA: Diagnosis not present

## 2020-11-19 DIAGNOSIS — N920 Excessive and frequent menstruation with regular cycle: Secondary | ICD-10-CM | POA: Diagnosis not present

## 2020-12-16 DIAGNOSIS — N898 Other specified noninflammatory disorders of vagina: Secondary | ICD-10-CM | POA: Diagnosis not present

## 2021-03-31 ENCOUNTER — Encounter: Payer: Self-pay | Admitting: Adult Health

## 2022-04-19 ENCOUNTER — Ambulatory Visit: Payer: Self-pay

## 2022-06-23 ENCOUNTER — Ambulatory Visit: Payer: Self-pay

## 2022-06-29 ENCOUNTER — Ambulatory Visit: Payer: Self-pay

## 2022-07-27 ENCOUNTER — Ambulatory Visit: Payer: Self-pay

## 2022-07-27 ENCOUNTER — Encounter: Payer: Self-pay | Admitting: Advanced Practice Midwife

## 2022-07-27 ENCOUNTER — Ambulatory Visit: Payer: Self-pay | Admitting: Advanced Practice Midwife

## 2022-07-27 DIAGNOSIS — Z113 Encounter for screening for infections with a predominantly sexual mode of transmission: Secondary | ICD-10-CM

## 2022-07-27 DIAGNOSIS — T7421XA Adult sexual abuse, confirmed, initial encounter: Secondary | ICD-10-CM | POA: Insufficient documentation

## 2022-07-27 DIAGNOSIS — F419 Anxiety disorder, unspecified: Secondary | ICD-10-CM

## 2022-07-27 DIAGNOSIS — T7421XS Adult sexual abuse, confirmed, sequela: Secondary | ICD-10-CM

## 2022-07-27 DIAGNOSIS — T7411XA Adult physical abuse, confirmed, initial encounter: Secondary | ICD-10-CM | POA: Insufficient documentation

## 2022-07-27 DIAGNOSIS — T7411XS Adult physical abuse, confirmed, sequela: Secondary | ICD-10-CM

## 2022-07-27 LAB — HM HIV SCREENING LAB: HM HIV Screening: NEGATIVE

## 2022-07-27 NOTE — Progress Notes (Signed)
[facility] Department  STI clinic/screening visit [place] [address] [phone]  Subjective:  Lauren Cook is a 21 y.o. SBF nonsmoker nullip female being seen today for an STI screening visit. The patient reports they do not have symptoms.  Patient reports that they do not desire a pregnancy in the next year.   They reported they are not interested in discussing contraception today.    Patient's last menstrual period was 07/02/2022 (exact date).   Patient has the following medical conditions:  There are no problems to display for this patient.   Chief Complaint  Patient presents with   STI Screening    HPI  Patient reports asymptomatic. Last sex 07/23/22 with condom; with current partner x 3 mo; 1 partner in last 3 mo. LMP 07/02/22. Last MJ edible 07/03/22. Last ETOH 07/23/22 1/2 bottle Tequila qo weekend. Finished Augmentin 07/20/22 for strep throat.  Does the patient using douching products? No  Last HIV test per patient/review of record was No results found for: "HMHIVSCREEN" No results found for: "HIV" Patient reports last pap was per pt 12/25/2019 neg   Screening for MPX risk: Does the patient have an unexplained rash? No Is the patient MSM? No Does the patient endorse multiple sex partners or anonymous sex partners? No Did the patient have close or sexual contact with a person diagnosed with MPX? No Has the patient traveled outside the [place] where MPX is endemic? No Is there a high clinical suspicion for MPX-- evidenced by one of the following No  -Unlikely to be chickenpox  -Lymphadenopathy  -Rash that present in same phase of evolution on any given body part See flowsheet for further details and programmatic requirements.   Immunization history:  Immunization History  Administered Date(s) Administered   HPV 9-valent 12/15/2014, 06/08/2015, 09/28/2015   Hepatitis A, Ped/Adol-2 Dose 09/02/2009, 04/28/2010   Hepatitis B,  PED/ADOLESCENT 12/03/1998, 02/01/1999, 07/15/1999, 07/13/2020   MMR 10/13/1999, 11/20/2002   MenQuadfi_Meningococcal Groups ACYW Conjugate 11/13/2014   Meningococcal B, OMV 11/13/2014, 12/15/2014   Meningococcal Conjugate 11/09/2010   Pneumococcal Conjugate PCV 7 01/03/2000, 04/04/2000   Tdap 04/25/2019   Varicella 10/13/1999, 09/02/2009, 04/25/2019     The following portions of the patient's history were reviewed and updated as appropriate: allergies, current medications, past medical history, past social history, past surgical history and problem list.  Objective:  There were no vitals filed for this visit.  Physical Exam Vitals and nursing note reviewed.  Constitutional:      Appearance: Normal appearance. She is normal weight.  HENT:     Head: Normocephalic and atraumatic.     Mouth/Throat:     Mouth: Mucous membranes are moist.     Pharynx: Oropharynx is clear. No oropharyngeal exudate or posterior oropharyngeal erythema.  Eyes:     Conjunctiva/sclera: Conjunctivae normal.  Pulmonary:     Effort: Pulmonary effort is normal.  Abdominal:     General: Abdomen is flat.     Palpations: Abdomen is soft. There is no mass.     Tenderness: There is no abdominal tenderness. There is no rebound.     Comments: Soft without masses or tenderness, good tone  Genitourinary:    General: Normal vulva.     Exam position: Lithotomy position.     Pubic Area: No rash or pubic lice.      Labia:        Right: No rash or lesion.        Left:  No rash or lesion.      Vagina: Vaginal discharge (white creamy leukorrhea, ph<4.5) present. No erythema, bleeding or lesions.     Cervix: Normal.     Uterus: Normal.      Adnexa: Right adnexa normal and left adnexa normal.     Rectum: Normal.     Comments: pH = <4.5 Lymphadenopathy:     Head:     Right side of head: No preauricular or posterior auricular adenopathy.     Left side of head: No preauricular or posterior auricular adenopathy.      Cervical: No cervical adenopathy.     Right cervical: No superficial, deep or posterior cervical adenopathy.    Left cervical: No superficial, deep or posterior cervical adenopathy.     Upper Body:     Right upper body: No supraclavicular, axillary or epitrochlear adenopathy.     Left upper body: No supraclavicular, axillary or epitrochlear adenopathy.     Lower Body: No right inguinal adenopathy. No left inguinal adenopathy.  Skin:    General: Skin is warm and dry.     Findings: No rash.  Neurological:     Mental Status: She is alert and oriented to person, place, and time.      Assessment and Plan:  Lauren Cook is a 20 y.o. female presenting to the [facility] Department for STI screening  1. Routine screening for STI (sexually transmitted infection)  - HIV [place] [facility] - Syphilis Serology, [place] [facility] - WET PREP FOR [place], YEAST, [place] [facility]  2. Screening examination for venereal disease Treat wet mount per standing orders Immunization nurse consult  - Gonococcus culture     No follow-ups on file.  No future appointments.  Herbie Saxon, CNM

## 2022-07-27 NOTE — Progress Notes (Addendum)
In House Labs WNL. States has never been pregnant therefore no breastfeeding. Bthiele RN

## 2022-07-28 LAB — WET PREP FOR TRICH, YEAST, CLUE
Trichomonas Exam: NEGATIVE
Yeast Exam: NEGATIVE

## 2022-08-01 LAB — GONOCOCCUS CULTURE

## 2022-09-27 ENCOUNTER — Encounter: Payer: Self-pay | Admitting: Otolaryngology

## 2022-09-29 NOTE — Discharge Instructions (Signed)
T & A INSTRUCTION SHEET - [facility]  CREIGHTON VAUGHT, MD   INFORMATION SHEET FOR A TONSILLECTOMY AND ADENDOIDECTOMY  About Your Tonsils and Adenoids  The tonsils and adenoids are normal body tissues that are part of our immune system.  They normally help to protect us against diseases that may enter our mouth and nose. However, sometimes the tonsils and/or adenoids become too large and obstruct our breathing, especially at night.    If either of these things happen it helps to remove the tonsils and adenoids in order to become healthier. The operation to remove the tonsils and adenoids is called a tonsillectomy and adenoidectomy.  The Location of Your Tonsils and Adenoids  The tonsils are located in the back of the throat on both side and sit in a cradle of muscles. The adenoids are located in the roof of the mouth, behind the nose, and closely associated with the opening of the Eustachian tube to the ear.  Surgery on Tonsils and Adenoids  A tonsillectomy and adenoidectomy is a short operation which takes about thirty minutes.  This includes being put to sleep and being awakened. Tonsillectomies and adenoidectomies are performed at [facility] and may require observation period in the recovery room prior to going home. Children are required to remain in recovery for at least 45 minutes.   Following the Operation for a Tonsillectomy  A cautery machine is used to control bleeding. Bleeding from a tonsillectomy and adenoidectomy is minimal and postoperatively the risk of bleeding is approximately four percent, although this rarely life threatening.  After your tonsillectomy and adenoidectomy post-op care at home: 1. Our patients are able to go home the same day. You may be given prescriptions for pain medications, if indicated. 2. It is extremely important to remember that fluid intake is of utmost importance after a tonsillectomy. The  amount that you drink must be maintained in the postoperative period. A good indication of whether a child is getting enough fluid is whether his/her urine output is constant. As long as children are urinating or wetting their diaper every 6 - 8 hours this is usually enough fluid intake.   3. Although rare, this is a risk of some bleeding in the first ten days after surgery. This usually occurs between day five and nine postoperatively. This risk of bleeding is approximately four percent. If you or your child should have any bleeding you should remain calm and notify our office or go directly to the emergency room at [facility] where they will contact us. Our doctors are available seven days a week for notification. We recommend sitting up quietly in a chair, place an ice pack on the front of the neck and spitting out the blood gently until we are able to contact you. Adults should gargle gently with ice water and this may help stop the bleeding. If the bleeding does not stop after a short time, i.e. 10 to 15 minutes, or seems to be increasing again, please contact us or go to the hospital.   4. It is common for the pain to be worse at 5 - 7 days postoperatively. This occurs because the "scab" is peeling off and the mucous membrane (skin of the throat) is growing back where the tonsils were.   5. It is common for a low-grade fever, less than 102, during the first week after a tonsillectomy and adenoidectomy. It is usually due to not   drinking enough liquids, and we suggest your use liquid Tylenol (acetaminophen) or the pain medicine with Tylenol (acetaminophen) prescribed in order to keep your temperature below 102. Please follow the directions on the back of the bottle. 6. Recommendations for post-operative pain in children and adults: a) For Children 12 and younger: Recommendations are for oral Tylenol (acetaminophen) and oral Motrin (Ibuprofen) along with a prescription dose of  Prednisolone which is a steroid to help with pain and swelling. Administer the Tylenol (acetaminophen) and Motrin as stated on bottle for patient's age/weight. Sometimes it may be necessary to alternate the Tylenol (acetaminophen) and Motrin for improved pain control. Motrin does last slightly longer so many patients benefit from being given this prior to bedtime. All children should avoid Aspirin products for 2 weeks following surgery. b) For children over the age of 12: Tylenol (acetaminophen) is the preferred first choice for pain control. Depending on your child's size, sometimes they will be given a combination of Tylenol (acetaminophen) and hydrocodone medication or sometimes it will be recommended they take Motrin (ibuprofen) in addition to the Tylenol (acetaminophen). Narcotics should always be used with caution in children following surgery as they can suppress their breathing and switching to over the counter Tylenol (acetaminophen) and Motrin (ibuprofen) as soon as possible is recommended. All patients should avoid Aspirin products for 2 weeks following surgery. c) Adults: Usually adults will require a narcotic pain medication following a tonsillectomy. This usually has either hydrocodone or oxycodone in it and can usually be taken every 4 to 6 hours as needed for moderate pain. If the medication does not have Tylenol (acetaminophen) in it, you may also supplement Tylenol (acetaminophen) as needed every 4 to 6 hours for breakthrough or mild pain. Adults are also given Viscous Lidocaine to swish and spit every 6 hours to help with topical pain. Adults should avoid Aspirin, Aleve, Motrin, and Ibuprofen products for 2 weeks following surgery as they can increase your risk of bleeding. 7. If you happen to look in the mirror or into your child's mouth you will see white/gray patches on the back of the throat. This is what a scab looks like in the mouth and is normal after having a tonsillectomy and  adenoidectomy. They will disappear once the tonsil areas heal completely. However, it may cause a noticeable odor, and this too will disappear with time.     8. You or your child may experience ear pain after having a tonsillectomy and adenoidectomy.  This is called referred pain and comes from the throat, but it is felt in the ears.  Ear pain is quite common and expected. It will usually go away after ten days. There is usually nothing wrong with the ears, and it is primarily due to the healing area stimulating the nerve to the ear that runs along the side of the throat. Use either the prescribed pain medicine or Tylenol (acetaminophen) as needed.  9. The throat tissues after a tonsillectomy are obviously sensitive. Smoking around children who have had a tonsillectomy significantly increases the risk of bleeding. DO NOT SMOKE!  What to Expect Each Day  First Day at Home 1. Patients will be discharged home the same day.  2. Drink at least four glasses of liquid a day. Clear, cool liquids are recommended. Fruit juices containing citric acid are not recommended because they tend to cause pain. Carbonated beverages are allowed if you pour them from glass to glass to remove the bubbles as these tend to cause   discomfort. Avoid alcoholic beverages.  3. Eat very soft foods such as soups, broth, jello, custard, pudding, ice cream, popsicles, applesauce, mashed potatoes, and in general anything that you can crush between your tongue and the roof of your mouth. Try adding Carnation Instant Breakfast Mix into your food for extra calories. It is not uncommon to lose 5 to 10 pounds of fluid weight. The weight will be gained back quickly once you're feeling better and drinking more.  4. Sleep with your head elevated on two pillows for about three days to help decrease the swelling.  5. DO NOT SMOKE!  Day Two  1. Rest as much as possible. Use common sense in your activities.  2. Continue drinking at least four glasses  of liquid per day.  3. Follow the soft diet.  4. Use your pain medication as needed.  Day Three  1. Advance your activity as you are able and continue to follow the previous day's suggestions.  Days Four Through Six  1. Advance your diet and begin to eat more solid foods such as chopped hamburger. 2. Advance your activities slowly. Children should be kept mostly around the house.  3. Not uncommonly, there will be more pain at this time. It is temporary, usually lasting a day or two.  Day Seven Through Ten  1. Most individuals by this time are able to return to work or school unless otherwise instructed. Consider sending children back to school for a half day on the first day back. 

## 2022-10-05 ENCOUNTER — Encounter: Payer: Self-pay | Admitting: Otolaryngology

## 2022-10-05 ENCOUNTER — Ambulatory Visit
Admission: RE | Admit: 2022-10-05 | Discharge: 2022-10-05 | Disposition: A | Discharge status: Home / Self Care | Payer: Commercial Managed Care - PPO | Source: Ambulatory Visit | Attending: Otolaryngology | Admitting: Otolaryngology

## 2022-10-05 ENCOUNTER — Encounter
Admission: RE | Disposition: A | Discharge status: Home / Self Care | Payer: Self-pay | Source: Ambulatory Visit | Attending: Otolaryngology

## 2022-10-05 ENCOUNTER — Ambulatory Visit: Payer: Commercial Managed Care - PPO | Admitting: Anesthesiology

## 2022-10-05 ENCOUNTER — Other Ambulatory Visit: Payer: Self-pay

## 2022-10-05 DIAGNOSIS — F419 Anxiety disorder, unspecified: Secondary | ICD-10-CM | POA: Diagnosis not present

## 2022-10-05 DIAGNOSIS — F17200 Nicotine dependence, unspecified, uncomplicated: Secondary | ICD-10-CM | POA: Insufficient documentation

## 2022-10-05 DIAGNOSIS — K219 Gastro-esophageal reflux disease without esophagitis: Secondary | ICD-10-CM | POA: Diagnosis not present

## 2022-10-05 DIAGNOSIS — J3501 Chronic tonsillitis: Secondary | ICD-10-CM | POA: Diagnosis present

## 2022-10-05 HISTORY — PX: TONSILLECTOMY: SHX5217

## 2022-10-05 HISTORY — DX: Gastro-esophageal reflux disease without esophagitis: K21.9

## 2022-10-05 LAB — POCT PREGNANCY, URINE: Preg Test, Ur: NEGATIVE

## 2022-10-05 SURGERY — TONSILLECTOMY
Anesthesia: General | Site: Throat | Laterality: Bilateral

## 2022-10-05 MED ORDER — BUPIVACAINE HCL (PF) 0.25 % IJ SOLN
INTRAMUSCULAR | Status: DC | PRN
  Administered 2022-10-05: 2 mL

## 2022-10-05 MED ORDER — OXYMETAZOLINE HCL 0.05 % NA SOLN
NASAL | Status: DC | PRN
  Administered 2022-10-05: 1 via TOPICAL

## 2022-10-05 MED ORDER — SUCCINYLCHOLINE CHLORIDE 200 MG/10ML IV SOSY
PREFILLED_SYRINGE | INTRAVENOUS | Status: DC | PRN
  Administered 2022-10-05: 100 mg via INTRAVENOUS

## 2022-10-05 MED ORDER — SUCCINYLCHOLINE CHLORIDE 200 MG/10ML IV SOSY: PREFILLED_SYRINGE | INTRAVENOUS | Status: DC | PRN

## 2022-10-05 MED ORDER — LIDOCAINE HCL (CARDIAC) PF 100 MG/5ML IV SOSY
PREFILLED_SYRINGE | INTRAVENOUS | Status: DC | PRN
  Administered 2022-10-05: 40 mg via INTRAVENOUS

## 2022-10-05 MED ORDER — MIDAZOLAM HCL 5 MG/5ML IJ SOLN
INTRAMUSCULAR | Status: DC | PRN
  Administered 2022-10-05: 2 mg via INTRAVENOUS

## 2022-10-05 MED ORDER — FENTANYL CITRATE PF 50 MCG/ML IJ SOSY: 25.0000 ug | PREFILLED_SYRINGE | INTRAMUSCULAR | Status: DC | PRN

## 2022-10-05 MED ORDER — DEXAMETHASONE SODIUM PHOSPHATE 4 MG/ML IJ SOLN
INTRAMUSCULAR | Status: DC | PRN
  Administered 2022-10-05: 8 mg via INTRAVENOUS

## 2022-10-05 MED ORDER — LACTATED RINGERS IV SOLN: INTRAVENOUS | Status: DC

## 2022-10-05 MED ORDER — OXYCODONE HCL 5 MG PO TABS: 5.0000 mg | ORAL_TABLET | Freq: Once | ORAL | Status: AC | PRN

## 2022-10-05 MED ORDER — OXYCODONE HCL 5 MG/5ML PO SOLN: 10.0000 mg | ORAL | 0 refills | Status: DC | PRN

## 2022-10-05 MED ORDER — DEXMEDETOMIDINE HCL IN NACL 80 MCG/20ML IV SOLN
INTRAVENOUS | Status: DC | PRN
  Administered 2022-10-05: 8 ug via BUCCAL
  Administered 2022-10-05: 4 ug via BUCCAL

## 2022-10-05 MED ORDER — FENTANYL CITRATE (PF) 100 MCG/2ML IJ SOLN
INTRAMUSCULAR | Status: DC | PRN
  Administered 2022-10-05: 50 ug via INTRAVENOUS

## 2022-10-05 MED ORDER — OXYCODONE HCL 5 MG/5ML PO SOLN
5.0000 mg | Freq: Once | ORAL | Status: AC | PRN
  Administered 2022-10-05: 5 mg via ORAL

## 2022-10-05 MED ORDER — LIDOCAINE VISCOUS HCL 2 % MT SOLN: 10.0000 mL | Freq: Four times a day (QID) | OROMUCOSAL | 0 refills | Status: DC | PRN

## 2022-10-05 MED ORDER — GLYCOPYRROLATE 0.2 MG/ML IJ SOLN
INTRAMUSCULAR | Status: DC | PRN
  Administered 2022-10-05: .2 mg via INTRAVENOUS

## 2022-10-05 MED ORDER — PROPOFOL 10 MG/ML IV BOLUS
INTRAVENOUS | Status: DC | PRN
  Administered 2022-10-05: 160 mg via INTRAVENOUS
  Administered 2022-10-05: 40 mg via INTRAVENOUS

## 2022-10-05 MED ORDER — ONDANSETRON HCL 4 MG PO TABS: 4.0000 mg | ORAL_TABLET | Freq: Three times a day (TID) | ORAL | 0 refills | Status: DC | PRN

## 2022-10-05 MED ORDER — ONDANSETRON HCL 4 MG/2ML IJ SOLN
INTRAMUSCULAR | Status: DC | PRN
  Administered 2022-10-05: 4 mg via INTRAVENOUS

## 2022-10-05 SURGICAL SUPPLY — 17 items
BLADE ELECT COATED/INSUL 125 (ELECTRODE) ×3 IMPLANT
CANISTER SUCT 1200ML W/VALVE (MISCELLANEOUS) ×3 IMPLANT
CATH ROBINSON RED A/P 10FR (CATHETERS) ×3 IMPLANT
COAG SUCTION FOOTSWITCH 10FR (SUCTIONS) ×6 IMPLANT
ELECT REM PT RETURN 9FT ADLT (ELECTROSURGICAL) ×2
ELECTRODE REM PT RTRN 9FT ADLT (ELECTROSURGICAL) ×3 IMPLANT
GLOVE SURG GAMMEX PI TX LF 7.5 (GLOVE) ×3 IMPLANT
HANDLE SUCTION POOLE (INSTRUMENTS) ×3 IMPLANT
KIT TURNOVER KIT A (KITS) ×3 IMPLANT
NS IRRIG 500ML POUR BTL (IV SOLUTION) ×3 IMPLANT
PACK TONSIL AND ADENOID CUSTOM (PACKS) ×3 IMPLANT
PENCIL SMOKE EVACUATOR (MISCELLANEOUS) ×3 IMPLANT
SLEEVE SUCTION 125 (MISCELLANEOUS) ×3 IMPLANT
SOL ANTI-FOG 6CC FOG-OUT (MISCELLANEOUS) ×3 IMPLANT
STRAP BODY AND KNEE 60X3 (MISCELLANEOUS) ×3 IMPLANT
SUCTION POOLE HANDLE (INSTRUMENTS) ×2
SYR 5ML LL (SYRINGE) ×3 IMPLANT

## 2022-10-05 NOTE — Anesthesia Postprocedure Evaluation (Signed)
Anesthesia Post Note  Patient: Lauren Cook  Procedure(s) Performed: TONSILLECTOMY (Bilateral: Throat)  Patient location during evaluation: PACU Anesthesia Type: General Level of consciousness: awake and alert Pain management: pain level controlled Vital Signs Assessment: post-procedure vital signs reviewed and stable Respiratory status: spontaneous breathing, nonlabored ventilation, respiratory function stable and patient connected to nasal cannula oxygen Cardiovascular status: blood pressure returned to baseline and stable Postop Assessment: no apparent nausea or vomiting Anesthetic complications: no   No notable events documented.   Last Vitals:  Vitals:   10/05/22 0915 10/05/22 0930  BP: (!) 121/105 112/82  Pulse: 70 62  Resp: 16 16  Temp:  (!) 36.4 C  SpO2: 95% 95%    Last Pain:  Vitals:   10/05/22 0930  TempSrc:   PainSc: 3                  Precious Haws Misha Antonini

## 2022-10-05 NOTE — Anesthesia Procedure Notes (Signed)
Procedure Name: Intubation Date/Time: 10/05/2022 8:24 AM  Performed by: Londell Moh, CRNAPre-anesthesia Checklist: Patient identified, Emergency Drugs available, Suction available, Patient being monitored and Timeout performed Patient Re-evaluated:Patient Re-evaluated prior to induction Oxygen Delivery Method: Circle system utilized Preoxygenation: Pre-oxygenation with 100% oxygen Induction Type: IV induction Ventilation: Mask ventilation without difficulty Laryngoscope Size: Mac and 3 Grade View: Grade I Tube type: Oral Rae Tube size: 7.0 mm Number of attempts: 1 Placement Confirmation: ETT inserted through vocal cords under direct vision, positive ETCO2 and breath sounds checked- equal and bilateral Tube secured with: Tape Dental Injury: Teeth and Oropharynx as per pre-operative assessment

## 2022-10-05 NOTE — Op Note (Signed)
..  10/05/2022  8:43 AM    Cinda Quest  [id number]   Pre-Op Dx:  Tonsillitis, chronic, Tonsillolithiasis  Post-op Dx: Tonsillitis, chronic, Tonsillolithiasis  Proc:Tonsillectomy > age 24  Surg: Colleena Kurtenbach  Anes:  General Endotracheal  EBL:  <33ml  Comp:  None  Findings:  3+ cryptic tonsils with mild inflammation  Procedure: After the patient was identified in holding and the history and physical and consent was reviewed, the patient was taken to the operating room and placed in a supine position.  General endotracheal anesthesia was induced in the normal fashion.  At this time, the patient was rotated 45 degrees and a shoulder roll was placed.  At this time, a McIvor mouthgag was inserted into the patient's oral cavity and suspended from the [place] stand without injury to teeth, lips, or gums.  Next a red rubber catheter was inserted into the patient left nostril for retraction of the uvula and soft palate superiorly.  Next a curved Alice clamp was attached to the patient's right superior tonsillar pole and retracted medially and inferiorly.  A Bovie electrocautery was used to dissect the patient's right tonsil in a subcapsular plane.  Meticulous hemostasis was achieved with Bovie suction cautery.  At this time, the mouth gag was released from suspension for 1 minute.  Attention now was directed to the patient's left side.  In a similar fashion the curved Alice clamp was attached to the superior pole and this was retracted medially and inferiorly and the tonsil was excised in a subcapsular plane with Bovie electrocautery.  After completion of the second tonsil, meticulous hemostasis was continued.  At this time, attention was directed to the patient's Adenoidectomy.  Under indirect visualization using an operating mirror, the adenoid tissue was visualized and noted to be absent in nature so no adenoidectomy needed.  At this time, the patient's nasal cavity and oral cavity was  irrigated with sterile saline.  13ml of 0.25% Marcaine was injected into the anterior and posterior tonsillar fossa bilaterally.  Following this  The care of patient was returned to anesthesia, awakened, and transferred to recovery in stable condition.  Dispo:  PACU to home  Plan: Soft diet.  Limit exercise and strenuous activity for 2 weeks.  Fluid hydration  Recheck my office three weeks.   Jeannie Fend Azavier Creson 8:43 AM 10/05/2022

## 2022-10-05 NOTE — Transfer of Care (Signed)
Immediate Anesthesia Transfer of Care Note  Patient: Lauren Cook  Procedure(s) Performed: TONSILLECTOMY (Bilateral: Throat)  Patient Location: PACU  Anesthesia Type: General ETT  Level of Consciousness: awake, alert  and patient cooperative  Airway and Oxygen Therapy: Patient Spontanous Breathing and Patient connected to supplemental oxygen  Post-op Assessment: Post-op Vital signs reviewed, Patient's Cardiovascular Status Stable, Respiratory Function Stable, Patent Airway and No signs of Nausea or vomiting  Post-op Vital Signs: Reviewed and stable  Complications: No notable events documented.

## 2022-10-05 NOTE — Anesthesia Preprocedure Evaluation (Signed)
Anesthesia Evaluation  Patient identified by MRN, date of birth, ID band Patient awake    Reviewed: Allergy & Precautions, NPO status , Patient's Chart, lab work & pertinent test results  History of Anesthesia Complications Negative for: history of anesthetic complications  Airway Mallampati: II  TM Distance: >3 FB Neck ROM: full    Dental  (+) Chipped   Pulmonary neg shortness of breath, Current Smoker and Patient abstained from smoking.   Pulmonary exam normal        Cardiovascular Exercise Tolerance: Good (-) angina (-) Past MI negative cardio ROS Normal cardiovascular exam     Neuro/Psych  Headaches  Anxiety        GI/Hepatic Neg liver ROS,GERD  Controlled,,  Endo/Other  negative endocrine ROS    Renal/GU      Musculoskeletal   Abdominal   Peds  Hematology negative hematology ROS (+)   Anesthesia Other Findings Past Medical History: No date: Allergy No date: GERD (gastroesophageal reflux disease)  Past Surgical History: 07/2013: WISDOM TOOTH EXTRACTION; Bilateral  BMI    Body Mass Index: 29.45 kg/m      Reproductive/Obstetrics negative OB ROS                             Anesthesia Physical Anesthesia Plan  ASA: 2  Anesthesia Plan: General ETT   Post-op Pain Management:    Induction: Intravenous  PONV Risk Score and Plan: Ondansetron, Dexamethasone, Midazolam and Treatment may vary due to age or medical condition  Airway Management Planned: Oral ETT  Additional Equipment:   Intra-op Plan:   Post-operative Plan: Extubation in OR  Informed Consent: I have reviewed the patients History and Physical, chart, labs and discussed the procedure including the risks, benefits and alternatives for the proposed anesthesia with the patient or authorized representative who has indicated his/her understanding and acceptance.     Dental Advisory Given  Plan Discussed with:  Anesthesiologist, CRNA and Surgeon  Anesthesia Plan Comments: (Patient consented for risks of anesthesia including but not limited to:  - adverse reactions to medications - damage to eyes, teeth, lips or other oral mucosa - nerve damage due to positioning  - sore throat or hoarseness - Damage to heart, brain, nerves, lungs, other parts of body or loss of life  Patient voiced understanding.)       Anesthesia Quick Evaluation

## 2022-10-05 NOTE — H&P (Signed)
..  History and Physical paper copy reviewed and updated date of procedure and will be scanned into system.  Patient seen and examined.  

## 2022-10-06 ENCOUNTER — Encounter: Payer: Self-pay | Admitting: Otolaryngology

## 2022-10-07 LAB — SURGICAL PATHOLOGY

## 2023-06-07 ENCOUNTER — Encounter (HOSPITAL_COMMUNITY): Payer: Self-pay | Admitting: Registered Nurse

## 2023-06-07 ENCOUNTER — Other Ambulatory Visit: Payer: Self-pay

## 2023-06-07 ENCOUNTER — Inpatient Hospital Stay (HOSPITAL_COMMUNITY)
Admission: AD | Admit: 2023-06-07 | Discharge: 2023-06-12 | DRG: 885 | Disposition: A | Discharge status: Home / Self Care | Payer: BC Managed Care – PPO | Source: Intra-hospital | Attending: Psychiatry | Admitting: Psychiatry

## 2023-06-07 ENCOUNTER — Ambulatory Visit (HOSPITAL_COMMUNITY)
Admission: EM | Admit: 2023-06-07 | Discharge: 2023-06-07 | Disposition: A | Discharge status: Other Acute Inpatient Hospital | Payer: BC Managed Care – PPO | Attending: Psychiatry | Admitting: Psychiatry

## 2023-06-07 DIAGNOSIS — F411 Generalized anxiety disorder: Secondary | ICD-10-CM

## 2023-06-07 DIAGNOSIS — T1491XA Suicide attempt, initial encounter: Secondary | ICD-10-CM | POA: Insufficient documentation

## 2023-06-07 DIAGNOSIS — Z803 Family history of malignant neoplasm of breast: Secondary | ICD-10-CM | POA: Diagnosis not present

## 2023-06-07 DIAGNOSIS — J45909 Unspecified asthma, uncomplicated: Secondary | ICD-10-CM | POA: Diagnosis present

## 2023-06-07 DIAGNOSIS — Z811 Family history of alcohol abuse and dependence: Secondary | ICD-10-CM

## 2023-06-07 DIAGNOSIS — F909 Attention-deficit hyperactivity disorder, unspecified type: Secondary | ICD-10-CM | POA: Diagnosis present

## 2023-06-07 DIAGNOSIS — F32A Depression, unspecified: Secondary | ICD-10-CM | POA: Diagnosis present

## 2023-06-07 DIAGNOSIS — F332 Major depressive disorder, recurrent severe without psychotic features: Secondary | ICD-10-CM | POA: Diagnosis present

## 2023-06-07 DIAGNOSIS — K30 Functional dyspepsia: Secondary | ICD-10-CM | POA: Diagnosis present

## 2023-06-07 DIAGNOSIS — C50019 Malignant neoplasm of nipple and areola, unspecified female breast: Secondary | ICD-10-CM | POA: Insufficient documentation

## 2023-06-07 DIAGNOSIS — F4325 Adjustment disorder with mixed disturbance of emotions and conduct: Secondary | ICD-10-CM

## 2023-06-07 DIAGNOSIS — Z7951 Long term (current) use of inhaled steroids: Secondary | ICD-10-CM | POA: Diagnosis not present

## 2023-06-07 DIAGNOSIS — G4700 Insomnia, unspecified: Secondary | ICD-10-CM | POA: Diagnosis present

## 2023-06-07 DIAGNOSIS — Z79899 Other long term (current) drug therapy: Secondary | ICD-10-CM

## 2023-06-07 DIAGNOSIS — F1210 Cannabis abuse, uncomplicated: Secondary | ICD-10-CM | POA: Diagnosis present

## 2023-06-07 DIAGNOSIS — R45851 Suicidal ideations: Secondary | ICD-10-CM | POA: Diagnosis present

## 2023-06-07 DIAGNOSIS — F17210 Nicotine dependence, cigarettes, uncomplicated: Secondary | ICD-10-CM | POA: Diagnosis present

## 2023-06-07 DIAGNOSIS — F4321 Adjustment disorder with depressed mood: Secondary | ICD-10-CM

## 2023-06-07 DIAGNOSIS — F410 Panic disorder [episodic paroxysmal anxiety] without agoraphobia: Secondary | ICD-10-CM | POA: Diagnosis present

## 2023-06-07 LAB — COMPREHENSIVE METABOLIC PANEL
ALT: 23 U/L (ref 0–44)
AST: 27 U/L (ref 15–41)
Albumin: 4.2 g/dL (ref 3.5–5.0)
Alkaline Phosphatase: 46 U/L (ref 38–126)
Anion gap: 13 (ref 5–15)
BUN: 14 mg/dL (ref 6–20)
CO2: 23 mmol/L (ref 22–32)
Calcium: 9.6 mg/dL (ref 8.9–10.3)
Chloride: 99 mmol/L (ref 98–111)
Creatinine, Ser: 0.82 mg/dL (ref 0.44–1.00)
GFR, Estimated: >60 mL/min (ref >60)
Glucose, Bld: 68 mg/dL — ABNORMAL LOW (ref 70–99)
Potassium: 4.2 mmol/L (ref 3.5–5.1)
Sodium: 135 mmol/L (ref 135–145)
Total Bilirubin: 0.9 mg/dL (ref 0.3–1.2)
Total Protein: 7.6 g/dL (ref 6.5–8.1)

## 2023-06-07 LAB — URINALYSIS, ROUTINE W REFLEX MICROSCOPIC
Bilirubin Urine: NEGATIVE
Glucose, UA: NEGATIVE mg/dL
Hgb urine dipstick: NEGATIVE
Ketones, ur: 80 mg/dL — AB
Leukocytes,Ua: NEGATIVE
Nitrite: NEGATIVE
Protein, ur: 30 mg/dL — AB
Specific Gravity, Urine: 1.035 — ABNORMAL HIGH (ref 1.005–1.030)
pH: 5 (ref 5.0–8.0)

## 2023-06-07 LAB — CBC WITH DIFFERENTIAL/PLATELET
Abs Immature Granulocytes: 0.02 10*3/uL (ref 0.00–0.07)
Basophils Absolute: 0 10*3/uL (ref 0.0–0.1)
Basophils Relative: 1 %
Eosinophils Absolute: 0.2 10*3/uL (ref 0.0–0.5)
Eosinophils Relative: 4 %
HCT: 43.2 % (ref 36.0–46.0)
Hemoglobin: 14.2 g/dL (ref 12.0–15.0)
Immature Granulocytes: 0 %
Lymphocytes Relative: 47 %
Lymphs Abs: 2.6 10*3/uL (ref 0.7–4.0)
MCH: 32.2 pg (ref 26.0–34.0)
MCHC: 32.9 g/dL (ref 30.0–36.0)
MCV: 98 fL (ref 80.0–100.0)
Monocytes Absolute: 0.4 10*3/uL (ref 0.1–1.0)
Monocytes Relative: 8 %
Neutro Abs: 2.2 10*3/uL (ref 1.7–7.7)
Neutrophils Relative %: 40 %
Platelets: 249 10*3/uL (ref 150–400)
RBC: 4.41 MIL/uL (ref 3.87–5.11)
RDW: 11.5 % (ref 11.5–15.5)
WBC: 5.5 10*3/uL (ref 4.0–10.5)
nRBC: 0 % (ref 0.0–0.2)

## 2023-06-07 LAB — POC URINE PREG, ED: Preg Test, Ur: NEGATIVE

## 2023-06-07 LAB — POCT URINE DRUG SCREEN - MANUAL ENTRY (I-SCREEN)
POC Amphetamine UR: NOT DETECTED
POC Buprenorphine (BUP): NOT DETECTED
POC Cocaine UR: NOT DETECTED
POC Marijuana UR: POSITIVE — AB
POC Methadone UR: NOT DETECTED
POC Methamphetamine UR: NOT DETECTED
POC Morphine: NOT DETECTED
POC Oxazepam (BZO): POSITIVE — AB
POC Oxycodone UR: NOT DETECTED
POC Secobarbital (BAR): NOT DETECTED

## 2023-06-07 LAB — LIPID PANEL
Cholesterol: 187 mg/dL (ref 0–200)
HDL: 74 mg/dL (ref >40)
LDL Cholesterol: 99 mg/dL (ref 0–99)
Total CHOL/HDL Ratio: 2.5 ratio
Triglycerides: 71 mg/dL (ref <150)
VLDL: 14 mg/dL (ref 0–40)

## 2023-06-07 LAB — MAGNESIUM: Magnesium: 2 mg/dL (ref 1.7–2.4)

## 2023-06-07 LAB — TSH: TSH: 1.109 u[IU]/mL (ref 0.350–4.500)

## 2023-06-07 LAB — ETHANOL: Alcohol, Ethyl (B): <10 mg/dL (ref <10)

## 2023-06-07 LAB — POCT PREGNANCY, URINE: Preg Test, Ur: NEGATIVE

## 2023-06-07 MED ORDER — AMPHETAMINE-DEXTROAMPHETAMINE 10 MG PO TABS: 10.0000 mg | ORAL_TABLET | ORAL | Status: DC

## 2023-06-07 MED ORDER — DIPHENHYDRAMINE HCL 25 MG PO CAPS: 50.0000 mg | ORAL_CAPSULE | Freq: Three times a day (TID) | ORAL | Status: DC | PRN

## 2023-06-07 MED ORDER — MONTELUKAST SODIUM 10 MG PO TABS: 10.0000 mg | ORAL_TABLET | Freq: Every evening | ORAL | Status: DC | PRN

## 2023-06-07 MED ORDER — DIPHENHYDRAMINE HCL 50 MG/ML IJ SOLN: 50.0000 mg | Freq: Three times a day (TID) | INTRAMUSCULAR | Status: DC | PRN

## 2023-06-07 MED ORDER — ACETAMINOPHEN 325 MG PO TABS
650.0000 mg | ORAL_TABLET | Freq: Four times a day (QID) | ORAL | Status: DC | PRN
  Administered 2023-06-08: 650 mg via ORAL
  Filled 2023-06-07: qty 2

## 2023-06-07 MED ORDER — ACETAMINOPHEN 325 MG PO TABS: 650.0000 mg | ORAL_TABLET | Freq: Four times a day (QID) | ORAL | Status: DC | PRN

## 2023-06-07 MED ORDER — MAGNESIUM HYDROXIDE 400 MG/5ML PO SUSP: 30.0000 mL | Freq: Every day | ORAL | Status: DC | PRN

## 2023-06-07 MED ORDER — MOMETASONE FURO-FORMOTEROL FUM 100-5 MCG/ACT IN AERO
2.0000 | INHALATION_SPRAY | Freq: Two times a day (BID) | RESPIRATORY_TRACT | Status: DC
  Administered 2023-06-07: 2 via RESPIRATORY_TRACT
  Filled 2023-06-07 (×2): qty 8.8

## 2023-06-07 MED ORDER — ALUM & MAG HYDROXIDE-SIMETH 200-200-20 MG/5ML PO SUSP: 30.0000 mL | ORAL | Status: DC | PRN

## 2023-06-07 MED ORDER — MIRTAZAPINE 15 MG PO TABS
15.0000 mg | ORAL_TABLET | Freq: Every day | ORAL | Status: DC
  Administered 2023-06-07 – 2023-06-09 (×3): 15 mg via ORAL
  Filled 2023-06-07 (×6): qty 1

## 2023-06-07 MED ORDER — HYDROCORTISONE 0.5 % EX CREA: 1.0000 | TOPICAL_CREAM | Freq: Two times a day (BID) | CUTANEOUS | Status: DC | PRN

## 2023-06-07 MED ORDER — CLOBETASOL PROPIONATE 0.05 % EX OINT
1.0000 | TOPICAL_OINTMENT | Freq: Two times a day (BID) | CUTANEOUS | Status: DC
  Administered 2023-06-08 – 2023-06-11 (×2): 1 via TOPICAL
  Filled 2023-06-07: qty 15

## 2023-06-07 MED ORDER — CLONAZEPAM 0.5 MG PO TABS: 1.0000 mg | ORAL_TABLET | Freq: Every day | ORAL | Status: DC | PRN

## 2023-06-07 NOTE — Discharge Instructions (Signed)
Patient is to be transferred to [facility]
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
## 2023-06-07 NOTE — ED Notes (Addendum)
Report given to [facility] RN@[facility]  adult unit
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
## 2023-06-07 NOTE — ED Notes (Signed)
Pt laying in bed calm and cooperative no pain or distress noted alert and orient x 4 will continue to monitor for safety
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
## 2023-06-07 NOTE — Progress Notes (Signed)
Pt has been accepted to [facility] TODAY 06/07/2023, pending labs, VS, EKG, and voluntary consent. Bed assignment: 301-1  Pt meets inpatient criteria per Assunta Found, NP  Attending Physician will be Phineas Inches, MD  Report can be called to: - Adult unit: [phone]  Pt can arrive after pending items are received  Care Team Notified: [facility] Rona Ravens, RN, Assunta Found, NP, and [address], LCSWA  [place], [place]  06/07/2023 4:13 PM

## 2023-06-07 NOTE — ED Notes (Signed)
Safe transport called to transport to [facility] 

## 2023-06-08 ENCOUNTER — Encounter (HOSPITAL_COMMUNITY): Payer: Self-pay | Admitting: Registered Nurse

## 2023-06-08 DIAGNOSIS — F332 Major depressive disorder, recurrent severe without psychotic features: Secondary | ICD-10-CM | POA: Diagnosis not present

## 2023-06-08 LAB — HEMOGLOBIN A1C
Hgb A1c MFr Bld: 5 % (ref 4.8–5.6)
Mean Plasma Glucose: 97 mg/dL

## 2023-06-08 LAB — PROLACTIN: Prolactin: 10.2 ng/mL (ref 4.8–33.4)

## 2023-06-08 MED ORDER — VITAMIN B-1 100 MG PO TABS
100.0000 mg | ORAL_TABLET | Freq: Every day | ORAL | Status: DC
  Administered 2023-06-09 – 2023-06-12 (×4): 100 mg via ORAL
  Filled 2023-06-08 (×5): qty 1

## 2023-06-08 MED ORDER — WHITE PETROLATUM EX OINT
TOPICAL_OINTMENT | CUTANEOUS | Status: AC
  Administered 2023-06-08: 1
  Filled 2023-06-08: qty 5

## 2023-06-08 MED ORDER — VENLAFAXINE HCL ER 37.5 MG PO CP24
37.5000 mg | ORAL_CAPSULE | Freq: Every day | ORAL | Status: AC
  Administered 2023-06-08: 37.5 mg via ORAL
  Filled 2023-06-08 (×2): qty 1

## 2023-06-08 MED ORDER — HYDROXYZINE HCL 25 MG PO TABS
25.0000 mg | ORAL_TABLET | Freq: Four times a day (QID) | ORAL | Status: AC | PRN
  Administered 2023-06-09: 25 mg via ORAL
  Filled 2023-06-08: qty 1

## 2023-06-08 MED ORDER — LOPERAMIDE HCL 2 MG PO CAPS: 2.0000 mg | ORAL_CAPSULE | ORAL | Status: AC | PRN

## 2023-06-08 MED ORDER — AMPHETAMINE-DEXTROAMPHETAMINE 10 MG PO TABS: 10.0000 mg | ORAL_TABLET | Freq: Every day | ORAL | Status: DC

## 2023-06-08 MED ORDER — LORAZEPAM 1 MG PO TABS: 1.0000 mg | ORAL_TABLET | Freq: Four times a day (QID) | ORAL | Status: AC | PRN

## 2023-06-08 MED ORDER — ONDANSETRON 4 MG PO TBDP: 4.0000 mg | ORAL_TABLET | Freq: Four times a day (QID) | ORAL | Status: AC | PRN

## 2023-06-08 MED ORDER — VENLAFAXINE HCL ER 75 MG PO CP24
75.0000 mg | ORAL_CAPSULE | Freq: Every day | ORAL | Status: DC
  Administered 2023-06-09: 75 mg via ORAL
  Filled 2023-06-08 (×3): qty 1

## 2023-06-08 MED ORDER — THIAMINE HCL 100 MG/ML IJ SOLN
100.0000 mg | Freq: Once | INTRAMUSCULAR | Status: AC
  Administered 2023-06-08: 100 mg via INTRAMUSCULAR
  Filled 2023-06-08: qty 2

## 2023-06-08 NOTE — Tx Team (Signed)
Initial Treatment Plan 06/08/2023 12:38 AM [name] IEP:[id number]    PATIENT STRESSORS: Educational concerns   Health problems   Legal issue     PATIENT STRENGTHS: Ability for insight  Average or above average intelligence  Communication skills  Motivation for treatment/growth  Supportive family/friends  Work skills    PATIENT IDENTIFIED PROBLEMS: Breast cancer dx  School  Legal issues  Mother's decline in health/caregiver stress               DISCHARGE CRITERIA:  Ability to meet basic life and health needs  PRELIMINARY DISCHARGE PLAN: Return to previous work or school arrangements  PATIENT/FAMILY INVOLVEMENT: This treatment plan has been presented to and reviewed with the patient, Lauren Cook, and/or family member.  The patient and family have been given the opportunity to ask questions and make suggestions.  Mathews Argyle, RN 06/08/2023, 12:38 AM

## 2023-06-08 NOTE — Progress Notes (Signed)
   06/08/23 0024  Psych Admission Type (Psych Patients Only)  Admission Status Voluntary  Psychosocial Assessment  Patient Complaints Crying spells;Suspiciousness;Nervousness;Depression;Anger;Panic attack;Worrying;Hopelessness;Restlessness;Sadness;Helplessness;Self-harm thoughts;Self-harm behaviors;Insomnia;Appetite decrease;Decreased concentration;Confusion  Eye Contact Fair  Facial Expression Flat;Sad;Worried  Affect Flat;Sad  Speech Logical/coherent;Soft;Slow  Interaction Assertive  Motor Activity Slow  Appearance/Hygiene Unremarkable  Behavior Characteristics Cooperative;Appropriate to situation;Calm  Mood Depressed;Sad  Thought Process  Coherency WDL  Content WDL  Delusions None reported or observed  Perception WDL  Hallucination None reported or observed  Judgment Impaired  Confusion WDL  Danger to Self  Current suicidal ideation? Denies  Self-Injurious Behavior Obsessive/ruminative self-injurious and active non-lethal gesturing observed or expressed   Agreement Not to Harm Self Yes  Description of Agreement verbal  Danger to Others  Danger to Others None reported or observed

## 2023-06-08 NOTE — Plan of Care (Signed)
  Problem: Education: Goal: Emotional status will improve Outcome: Progressing   Problem: Education: Goal: Mental status will improve Outcome: Progressing   Problem: Activity: Goal: Interest or engagement in activities will improve Outcome: Progressing   Problem: Activity: Goal: Sleeping patterns will improve Outcome: Progressing   Problem: Coping: Goal: Ability to verbalize frustrations and anger appropriately will improve Outcome: Progressing   Problem: Safety: Goal: Periods of time without injury will increase Outcome: Progressing   Problem: Health Behavior/Discharge Planning: Goal: Compliance with therapeutic regimen will improve Outcome: Progressing
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
## 2023-06-08 NOTE — Group Note (Signed)
Date:  06/08/2023 Time:  9:17 PM  Group Topic/Focus:  Wrap-Up Group:   The focus of this group is to help patients review their daily goal of treatment and discuss progress on daily workbooks.    Participation Level:  Active  Participation Quality:  Appropriate and Sharing  Affect:  Appropriate  Cognitive:  Appropriate  Insight: Appropriate  Engagement in Group:  Engaged  Modes of Intervention:  Activity and Socialization  Additional Comments:  The patient shared that it was her first day on the unit and it stated off "funky". The patient stated that her day did get better. The patient stated that interaction with others on the unit helped as well as attending groups. The patient stated that she finally ate and it felt good because she hasn't really eaten anything in about a week. The patient rated her day a 9/10. The patient did participate in the group activity.   Lauren Cook 06/08/2023, 9:17 PM

## 2023-06-08 NOTE — Progress Notes (Signed)
Patient rated her depression level 8/10 and her anxiety level 10/10 with 10 being the highest and 0 none. Patient denied SI/HI and AVH . Pt  medication and group compliant, pt observed interacting well with some peers.  CIWA= 1. Safety maintained.  06/08/23 0802  Psych Admission Type (Psych Patients Only)  Admission Status Voluntary  Psychosocial Assessment  Patient Complaints Anxiety;Crying spells;Depression;Sadness  Eye Contact Fair  Facial Expression Sad;Anxious;Flat  Affect Depressed;Anxious;Sad;Flat  Speech Logical/coherent  Interaction Assertive  Motor Activity Other (Comment) (WNL)  Appearance/Hygiene Unremarkable  Behavior Characteristics Cooperative;Appropriate to situation  Mood Depressed;Anxious;Sad  Thought Process  Coherency WDL  Content WDL  Delusions None reported or observed  Perception WDL  Hallucination None reported or observed  Judgment Impaired  Confusion WDL  Danger to Self  Current suicidal ideation? Denies  Self-Injurious Behavior No self-injurious ideation or behavior indicators observed or expressed   Agreement Not to Harm Self Yes  Description of Agreement Verbal  Danger to Others  Danger to Others None reported or observed

## 2023-06-08 NOTE — Group Note (Signed)
LCSW Group Therapy Note   Group Date: 06/08/2023 Start Time: 1100 End Time: 1200  LCSW Group Therapy   Type of Therapy and Topic:  Group Therapy:  Setting Goals   Participation Level: Active   Description of Group: In this process group, patients discussed using strengths to work toward goals and address challenges.  Patients identified two positive things about themselves and one goal they were working on.  Patients were given the opportunity to share openly and support each other's plan for self-empowerment.  The group discussed the value of gratitude and were encouraged to have a daily reflection of positive characteristics or circumstances.  Patients were encouraged to identify a plan to utilize their strengths to work on current challenges and goals.   Therapeutic Goals Patient will verbalize personal strengths/positive qualities and relate how these can assist with achieving desired personal goals Patients will verbalize affirmation of peers plans for personal change and goal setting Patients will explore the value of gratitude and positive focus as related to successful achievement of goals Patients will verbalize a plan for regular reinforcement of personal positive qualities and circumstances.   Summary of Patient Progress: Pt was active in group   Therapeutic Modalities Cognitive Behavioral Therapy Motivational Interviewing      Izell Lowes, Theresia Majors 06/08/2023  2:00 PM

## 2023-06-09 ENCOUNTER — Encounter (HOSPITAL_COMMUNITY): Payer: Self-pay

## 2023-06-09 DIAGNOSIS — F332 Major depressive disorder, recurrent severe without psychotic features: Secondary | ICD-10-CM | POA: Diagnosis not present

## 2023-06-09 MED ORDER — VENLAFAXINE HCL ER 75 MG PO CP24
75.0000 mg | ORAL_CAPSULE | Freq: Every day | ORAL | Status: DC
  Administered 2023-06-10 – 2023-06-12 (×3): 75 mg via ORAL
  Filled 2023-06-09 (×4): qty 1

## 2023-06-09 NOTE — [facility] Group Notes (Signed)
[facility] Group Notes:  (Nursing/MHT/Case Management/Adjunct)  Date:  06/09/2023  Time:  9:14 PM  Type of Therapy:   Wrap-Up Group  Participation Level:  Active  Participation Quality:  Appropriate, Attentive, and Sharing  Affect:  Appropriate and Tearful  Cognitive:  Alert, Appropriate, and Oriented  Insight:  Appropriate  Engagement in Group:  Engaged  Modes of Intervention:  Discussion and Support  Summary of Progress/Problems: Patient attended AA group. Was active in group. Patient did state she doesn't feel like she deserves to be alive. She states she is scared to leave and is scared of what awaits her on the outside.  Jayde Daffin 06/09/2023, 9:14 PM

## 2023-06-09 NOTE — [facility] Suicide Risk Assessment (Signed)
[facility] INPATIENT:  Family/Significant Other Suicide Prevention Education  Suicide Prevention Education:  Education Completed; Lauren Cook ( mom ) [phone],  has been identified by the patient as the family member/significant other with whom the patient will be residing, and identified as the person(s) who will aid the patient in the event of a mental health crisis (suicidal ideations/suicide attempt).  With written consent from the patient, the family member/significant other has been provided the following suicide prevention education, prior to the and/or following the discharge of the patient.  The suicide prevention education provided includes the following: Suicide risk factors Suicide prevention and interventions National Suicide Hotline telephone number [facility] assessment telephone number [facility] Emergency Assistance 911 [facility] and/or Residential Mobile Crisis Unit telephone number  Request made of family/significant other to: Remove weapons (e.g., guns, rifles, knives), all items previously/currently identified as safety concern.   Remove drugs/medications (over-the-counter, prescriptions, illicit drugs), all items previously/currently identified as a safety concern.  The family member/significant other verbalizes understanding of the suicide prevention education information provided.  The family member/significant other agrees to remove the items of safety concern listed above.  Lauren Cook  06/09/2023, 12:28 PM

## 2023-06-09 NOTE — Plan of Care (Signed)
  Problem: Education: Goal: Mental status will improve Outcome: Progressing   Problem: Activity: Goal: Interest or engagement in activities will improve Outcome: Progressing   Problem: Coping: Goal: Ability to verbalize frustrations and anger appropriately will improve Outcome: Progressing   Problem: Safety: Goal: Periods of time without injury will increase Outcome: Progressing   Problem: Activity: Goal: Interest or engagement in leisure activities will improve Outcome: Progressing

## 2023-06-09 NOTE — Progress Notes (Signed)
Patient rated her anxiety level 3/10 and her depression level 3/10 with 10 being the highest and 0 none. Patient stated her goal for today as, " Talk to people who will help me".  Patient noted to be tearful at times on shift. Staff able to provide support. Medication and group compliant. Pt denied SI/HI and AVH. Ciwa=2,1. Safety maintained.  06/09/23 0800  Psych Admission Type (Psych Patients Only)  Admission Status Voluntary  Psychosocial Assessment  Patient Complaints Anxiety;Crying spells;Depression  Eye Contact Fair  Facial Expression Anxious;Sad  Affect Anxious;Depressed  Speech Logical/coherent  Interaction Assertive  Motor Activity Other (Comment) (WNL)  Appearance/Hygiene Unremarkable  Behavior Characteristics Appropriate to situation;Cooperative;Anxious  Mood Depressed;Anxious  Thought Process  Coherency WDL  Content WDL  Delusions None reported or observed  Perception WDL  Hallucination None reported or observed  Judgment Impaired  Confusion WDL  Danger to Self  Current suicidal ideation? Denies  Agreement Not to Harm Self Yes  Description of Agreement Verbal  Danger to Others  Danger to Others None reported or observed

## 2023-06-09 NOTE — Progress Notes (Signed)
   06/08/23 2211  Psych Admission Type (Psych Patients Only)  Admission Status Voluntary  Psychosocial Assessment  Patient Complaints Anxiety  Eye Contact Fair  Facial Expression Anxious;Flat  Affect Anxious  Speech Logical/coherent  Interaction Assertive  Motor Activity Other (Comment) (WDL)  Appearance/Hygiene Unremarkable  Behavior Characteristics Cooperative;Appropriate to situation  Mood Depressed  Thought Process  Coherency WDL  Content WDL  Delusions None reported or observed  Perception WDL  Hallucination None reported or observed  Judgment Impaired  Confusion WDL  Danger to Self  Current suicidal ideation? Denies  Self-Injurious Behavior No self-injurious ideation or behavior indicators observed or expressed   Agreement Not to Harm Self Yes  Description of Agreement verbal  Danger to Others  Danger to Others None reported or observed

## 2023-06-09 NOTE — Group Note (Signed)
Recreation Therapy Group Note   Group Topic:Problem Solving  Group Date: 06/09/2023 Start Time: 0930 End Time: 1000 Facilitators: Georges Victorio-McCall, LRT,CTRS Location: [address] Dayroom   Group Topic: Communication, Team Building, Problem Solving   Goal Area(s) Addresses:  Patient will effectively work with peer towards shared goal.  Patient will identify skills used to make activity successful.  Patient will share challenges and verbalize solution-driven approaches used. Patient will identify how skills used during activity can be used to reach post d/c goals.    Group Description: [employer]. Patients were provided the following materials: 5 drinking straws, 5 rubber bands, 5 paper clips, 2 index cards, 2 drinking cups, and 2 toilet paper rolls. Using the provided materials patients were asked to build a launching mechanism to launch a ping pong ball across the room, approximately 10 feet. Patients were divided into teams of 3-5. Instructions required all materials be incorporated into the device, functionality of items left to the peer group's discretion.   Education: Pharmacist, community, Scientist, physiological, Air cabin crew, Building control surveyor.    Education Outcome: Acknowledges education/In group clarification offered/Needs additional education.    Affect/Mood: Appropriate   Participation Level: Engaged   Participation Quality: Independent   Behavior: Appropriate   Speech/Thought Process: Focused   Insight: Good   Judgement: Good   Modes of Intervention: STEM Activity   Patient Response to Interventions:  Engaged   Education Outcome:  In group clarification offered    Clinical Observations/Individualized Feedback: Pt attended and participated in group session.    Plan: Continue to engage patient in RT group sessions 2-3x/week.   Sweta Halseth-McCall, LRT,CTRS 06/09/2023 11:18 AM
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
## 2023-06-09 NOTE — BH IP Treatment Plan (Signed)
Interdisciplinary Treatment and Diagnostic Plan Update  06/09/2023 Time of Session: 10:50 AM  Lauren Cook MRN: [id number]  Principal Diagnosis: MDD (major depressive disorder), recurrent episode, severe (HCC)  Secondary Diagnoses: Principal Problem:   MDD (major depressive disorder), recurrent episode, severe (HCC) Active Problems:   GAD (generalized anxiety disorder)   Adjustment disorder with depressed mood   Current Medications:  Current Facility-Administered Medications  Medication Dose Route Frequency Provider Last Rate Last Admin   acetaminophen (TYLENOL) tablet 650 mg  650 mg Oral Q6H PRN Rankin, Shuvon B, NP   650 mg at 06/08/23 1151   alum & mag hydroxide-simeth (MAALOX/MYLANTA) 200-200-20 MG/5ML suspension 30 mL  30 mL Oral Q4H PRN Rankin, Shuvon B, NP       clobetasol ointment (TEMOVATE) 0.05 % 1 Application  1 Application Topical BID Rankin, Shuvon B, NP   1 Application at 06/09/23 0745   diphenhydrAMINE (BENADRYL) capsule 50 mg  50 mg Oral TID PRN Rankin, Shuvon B, NP       Or   diphenhydrAMINE (BENADRYL) injection 50 mg  50 mg Intramuscular TID PRN Rankin, Shuvon B, NP       hydrocortisone cream 0.5 % 1 Application  1 Application Topical BID PRN Rankin, Shuvon B, NP       hydrOXYzine (ATARAX) tablet 25 mg  25 mg Oral Q6H PRN Massengill, Harrold Donath, MD   25 mg at 06/09/23 2725   loperamide (IMODIUM) capsule 2-4 mg  2-4 mg Oral PRN Massengill, Harrold Donath, MD       LORazepam (ATIVAN) tablet 1 mg  1 mg Oral Q6H PRN Massengill, Nathan, MD       magnesium hydroxide (MILK OF MAGNESIA) suspension 30 mL  30 mL Oral Daily PRN Rankin, Shuvon B, NP       mirtazapine (REMERON) tablet 15 mg  15 mg Oral QHS Rankin, Shuvon B, NP   15 mg at 06/08/23 2121   mometasone-formoterol (DULERA) 100-5 MCG/ACT inhaler 2 puff  2 puff Inhalation BID Rankin, Shuvon B, NP   2 puff at 06/07/23 2317   montelukast (SINGULAIR) tablet 10 mg  10 mg Oral QHS PRN Rankin, Shuvon B, NP       ondansetron  (ZOFRAN-ODT) disintegrating tablet 4 mg  4 mg Oral Q6H PRN Massengill, Nathan, MD       thiamine (Vitamin B-1) tablet 100 mg  100 mg Oral Daily Massengill, Nathan, MD   100 mg at 06/09/23 0744   venlafaxine XR (EFFEXOR-XR) 24 hr capsule 75 mg  75 mg Oral Q breakfast Massengill, Harrold Donath, MD   75 mg at 06/09/23 0744   PTA Medications: Medications Prior to Admission  Medication Sig Dispense Refill Last Dose   albuterol (VENTOLIN HFA) 108 (90 Base) MCG/ACT inhaler Inhale 2 puffs into the lungs every 4 (four) hours as needed.      alprazolam (XANAX) 2 MG tablet Take 2 mg by mouth daily.      amphetamine-dextroamphetamine (ADDERALL) 10 MG tablet Take 10 mg by mouth 2 (two) times daily.      budesonide-formoterol (SYMBICORT) 80-4.5 MCG/ACT inhaler Inhale 2 puffs into the lungs 2 (two) times daily as needed (For shortness of breath).      clobetasol ointment (TEMOVATE) 0.05 % Apply 1 Application topically 2 (two) times daily.      clonazePAM (KLONOPIN) 2 MG tablet Take 2 mg by mouth 2 (two) times daily as needed for anxiety.      hydrocortisone cream 0.5 % Apply 1 Application topically 2 (two)  times daily as needed (Apply to affected area).      levocetirizine (XYZAL) 5 MG tablet Take 5 mg by mouth daily as needed for allergies.      mirtazapine (REMERON) 7.5 MG tablet Take 7.5 mg by mouth at bedtime.      montelukast (SINGULAIR) 10 MG tablet Take 10 mg by mouth at bedtime as needed (For allergies).      Olopatadine HCl 0.6 % SOLN Place 2 sprays into both nostrils 2 (two) times daily as needed (For allergies).       Patient Stressors: Educational concerns   Health problems   Legal issue    Patient Strengths: Ability for insight  Average or above average intelligence  Communication skills  Motivation for treatment/growth  Supportive family/friends  Work skills   Treatment Modalities: Medication Management, Group therapy, Case management,  1 to 1 session with clinician, Psychoeducation,  Recreational therapy.   Physician Treatment Plan for Primary Diagnosis: MDD (major depressive disorder), recurrent episode, severe (HCC) Long Term Goal(s): Improvement in symptoms so as ready for discharge   Short Term Goals: Ability to identify and develop effective coping behaviors will improve Ability to maintain clinical measurements within normal limits will improve Compliance with prescribed medications will improve Ability to identify triggers associated with substance abuse/mental health issues will improve Ability to identify changes in lifestyle to reduce recurrence of condition will improve Ability to verbalize feelings will improve Ability to disclose and discuss suicidal ideas Ability to demonstrate self-control will improve  Medication Management: Evaluate patient's response, side effects, and tolerance of medication regimen.  Therapeutic Interventions: 1 to 1 sessions, Unit Group sessions and Medication administration.  Evaluation of Outcomes: Not Progressing  Physician Treatment Plan for Secondary Diagnosis: Principal Problem:   MDD (major depressive disorder), recurrent episode, severe (HCC) Active Problems:   GAD (generalized anxiety disorder)   Adjustment disorder with depressed mood  Long Term Goal(s): Improvement in symptoms so as ready for discharge   Short Term Goals: Ability to identify and develop effective coping behaviors will improve Ability to maintain clinical measurements within normal limits will improve Compliance with prescribed medications will improve Ability to identify triggers associated with substance abuse/mental health issues will improve Ability to identify changes in lifestyle to reduce recurrence of condition will improve Ability to verbalize feelings will improve Ability to disclose and discuss suicidal ideas Ability to demonstrate self-control will improve     Medication Management: Evaluate patient's response, side effects, and  tolerance of medication regimen.  Therapeutic Interventions: 1 to 1 sessions, Unit Group sessions and Medication administration.  Evaluation of Outcomes: Not Progressing   RN Treatment Plan for Primary Diagnosis: MDD (major depressive disorder), recurrent episode, severe (HCC) Long Term Goal(s): Knowledge of disease and therapeutic regimen to maintain health will improve  Short Term Goals: Ability to remain free from injury will improve, Ability to verbalize frustration and anger appropriately will improve, Ability to demonstrate self-control, Ability to participate in decision making will improve, Ability to verbalize feelings will improve, Ability to disclose and discuss suicidal ideas, Ability to identify and develop effective coping behaviors will improve, and Compliance with prescribed medications will improve  Medication Management: RN will administer medications as ordered by provider, will assess and evaluate patient's response and provide education to patient for prescribed medication. RN will report any adverse and/or side effects to prescribing provider.  Therapeutic Interventions: 1 on 1 counseling sessions, Psychoeducation, Medication administration, Evaluate responses to treatment, Monitor vital signs and CBGs as ordered, Perform/monitor CIWA,  COWS, AIMS and Fall Risk screenings as ordered, Perform wound care treatments as ordered.  Evaluation of Outcomes: Not Progressing   LCSW Treatment Plan for Primary Diagnosis: MDD (major depressive disorder), recurrent episode, severe (HCC) Long Term Goal(s): Safe transition to appropriate next level of care at discharge, Engage patient in therapeutic group addressing interpersonal concerns.  Short Term Goals: Engage patient in aftercare planning with referrals and resources, Increase social support, Increase ability to appropriately verbalize feelings, Increase emotional regulation, Facilitate acceptance of mental health diagnosis and  concerns, Facilitate patient progression through stages of change regarding substance use diagnoses and concerns, Identify triggers associated with mental health/substance abuse issues, and Increase skills for wellness and recovery  Therapeutic Interventions: Assess for all discharge needs, 1 to 1 time with Social worker, Explore available resources and support systems, Assess for adequacy in community support network, Educate family and significant other(s) on suicide prevention, Complete Psychosocial Assessment, Interpersonal group therapy.  Evaluation of Outcomes: Not Progressing   Progress in Treatment: Attending groups: Yes. Participating in groups: Yes. Taking medication as prescribed: Yes. Toleration medication: Yes.Pt did asked about not being on Remeron but to find something to help her eat more since she is losing weight  Family/Significant other contact made: Yes, individual(s) contacted:  Mother Maxine Glenn Patient understands diagnosis: Yes. Discussing patient identified problems/goals with staff: Yes. Medical problems stabilized or resolved: Yes. Denies suicidal/homicidal ideation: Yes. Issues/concerns per patient self-inventory: No.   New problem(s) identified: No, Describe:  None reported   New Short Term/Long Term Goal(s):medication stabilization, elimination of SI thoughts, development of comprehensive mental wellness plan.    Patient Goals:  " Get a new psychiatrist / therapist , get my medications regulated , and get off Benzo's "   Discharge Plan or Barriers: Patient recently admitted. CSW will continue to follow and assess for appropriate referrals and possible discharge planning.    Reason for Continuation of Hospitalization: Depression Medical Issues Medication stabilization Suicidal ideation  Estimated Length of Stay: 2-3 Days  Last 3 [place] Suicide Severity Risk Score: Flowsheet Row [facility] Most recent reading at 06/08/2023 12:12 AM ED from 06/07/2023 in [facility] Most recent reading at 06/07/2023  1:02 PM Admission (Discharged) from 10/05/2022 in  [facility] Most recent reading at 10/05/2022  7:31 AM  C-SSRS RISK CATEGORY High Risk High Risk No Risk       Last PHQ 2/9 Scores:    02/18/2020    9:08 AM  Depression screen PHQ 2/9  Decreased Interest 0  Down, Depressed, Hopeless 0  PHQ - 2 Score 0  Altered sleeping 0  Tired, decreased energy 0  Change in appetite 0  Feeling bad or failure about yourself  0  Trouble concentrating 0  Moving slowly or fidgety/restless 0  Suicidal thoughts 0  PHQ-9 Score 0    Scribe for Treatment Team: Beather Arbour 06/09/2023 11:55 AM

## 2023-06-10 DIAGNOSIS — F332 Major depressive disorder, recurrent severe without psychotic features: Secondary | ICD-10-CM | POA: Diagnosis not present

## 2023-06-10 NOTE — Progress Notes (Signed)
[facility] MD Progress Note  06/10/2023 1:40 PM Lauren Cook  MRN:  [id number] Subjective:  Lauren Cook was seen in the common area on rounds. She continues to have anxiety and depression. She also advocates to go home to be with her family. She believes that she will have to go to jail due to recent events. She isn't sleeping very well. She does not like the remeron and wants it stopped. She does not feel that it is helping her appetite or her sleep. She says "I don't want to be on a lot of pills" and is worried that any medications she takes for Paget's disease will interact with the psychiatric ones. She states that she would not try to kill herself again. She denies hallucinations. She is able to talk about her negative experiences with doctors in the past.  Principal Problem: MDD (major depressive disorder), recurrent episode, severe (HCC) Diagnosis: Principal Problem:   MDD (major depressive disorder), recurrent episode, severe (HCC) Active Problems:   GAD (generalized anxiety disorder)   Adjustment disorder with depressed mood  Total Time spent with patient: 20 minutes  Past Psychiatric History: see H&P  Past Medical History:  Past Medical History:  Diagnosis Date   Allergy    GERD (gastroesophageal reflux disease)     Past Surgical History:  Procedure Laterality Date   TONSILLECTOMY Bilateral 10/05/2022   Procedure: TONSILLECTOMY;  Surgeon: Bud Face, MD;  Location: [facility];  Service: ENT;  Laterality: Bilateral;   WISDOM TOOTH EXTRACTION Bilateral 07/2013   Family History:  Family History  Problem Relation Age of Onset   Breast cancer Mother    Family Psychiatric  History: see H&P Social History:  Social History   Substance and Sexual Activity  Alcohol Use Yes   Alcohol/week: 10.0 standard drinks of alcohol   Types: 10 Standard drinks or equivalent per week   Comment: qo weekend 1/2 bottle Tequila     Social History   Substance and Sexual Activity   Drug Use Yes   Types: Marijuana   Comment: last use 07/03/22 edible    Social History   Socioeconomic History   Marital status: Single    Spouse name: Not on file   Number of children: Not on file   Years of education: Not on file   Highest education level: Not on file  Occupational History   Not on file  Tobacco Use   Smoking status: Some Days    Types: Cigarettes   Smokeless tobacco: Never  Vaping Use   Vaping status: Never Used  Substance and Sexual Activity   Alcohol use: Yes    Alcohol/week: 10.0 standard drinks of alcohol    Types: 10 Standard drinks or equivalent per week    Comment: qo weekend 1/2 bottle Tequila   Drug use: Yes    Types: Marijuana    Comment: last use 07/03/22 edible   Sexual activity: Yes    Partners: Male  Other Topics Concern   Not on file  Social History Narrative   ** Merged History Encounter **       Social Determinants of Health   Financial Resource Strain: Not on file  Food Insecurity: No Food Insecurity (06/08/2023)   Hunger Vital Sign    Worried About Running Out of Food in the Last Year: Never true    Ran Out of Food in the Last Year: Never true  Transportation Needs: No Transportation Needs (06/08/2023)   PRAPARE - Transportation    Lack of  Transportation (Medical): No    Lack of Transportation (Non-Medical): No  Physical Activity: Not on file  Stress: Not on file  Social Connections: Not on file   Additional Social History:                         Sleep: Poor  Appetite:  Fair  Current Medications: Current Facility-Administered Medications  Medication Dose Route Frequency Provider Last Rate Last Admin   acetaminophen (TYLENOL) tablet 650 mg  650 mg Oral Q6H PRN Rankin, Shuvon B, NP   650 mg at 06/08/23 1151   alum & mag hydroxide-simeth (MAALOX/MYLANTA) 200-200-20 MG/5ML suspension 30 mL  30 mL Oral Q4H PRN Rankin, Shuvon B, NP       clobetasol ointment (TEMOVATE) 0.05 % 1 Application  1 Application Topical  BID Rankin, Shuvon B, NP   1 Application at 06/08/23 1708   diphenhydrAMINE (BENADRYL) capsule 50 mg  50 mg Oral TID PRN Rankin, Shuvon B, NP       Or   diphenhydrAMINE (BENADRYL) injection 50 mg  50 mg Intramuscular TID PRN Rankin, Shuvon B, NP       hydrocortisone cream 0.5 % 1 Application  1 Application Topical BID PRN Rankin, Shuvon B, NP       hydrOXYzine (ATARAX) tablet 25 mg  25 mg Oral Q6H PRN Massengill, Harrold Donath, MD   25 mg at 06/09/23 1308   loperamide (IMODIUM) capsule 2-4 mg  2-4 mg Oral PRN Massengill, Harrold Donath, MD       LORazepam (ATIVAN) tablet 1 mg  1 mg Oral Q6H PRN Massengill, Nathan, MD       magnesium hydroxide (MILK OF MAGNESIA) suspension 30 mL  30 mL Oral Daily PRN Rankin, Shuvon B, NP       mometasone-formoterol (DULERA) 100-5 MCG/ACT inhaler 2 puff  2 puff Inhalation BID Rankin, Shuvon B, NP   2 puff at 06/07/23 2317   montelukast (SINGULAIR) tablet 10 mg  10 mg Oral QHS PRN Rankin, Shuvon B, NP       ondansetron (ZOFRAN-ODT) disintegrating tablet 4 mg  4 mg Oral Q6H PRN Massengill, Nathan, MD       thiamine (Vitamin B-1) tablet 100 mg  100 mg Oral Daily Massengill, Nathan, MD   100 mg at 06/10/23 1017   venlafaxine XR (EFFEXOR-XR) 24 hr capsule 75 mg  75 mg Oral Q breakfast Armandina Stammer I, NP   75 mg at 06/10/23 0537    Lab Results: No results found for this or any previous visit (from the past 48 hour(s)).  Blood Alcohol level:  Lab Results  Component Value Date   ETH <10 06/07/2023    Metabolic Disorder Labs: Lab Results  Component Value Date   HGBA1C 5.0 06/07/2023   MPG 97 06/07/2023   Lab Results  Component Value Date   PROLACTIN 10.2 06/07/2023   Lab Results  Component Value Date   CHOL 187 06/07/2023   TRIG 71 06/07/2023   HDL 74 06/07/2023   CHOLHDL 2.5 06/07/2023   VLDL 14 06/07/2023   LDLCALC 99 06/07/2023   LDLCALC 96 05/15/2020    Physical Findings: AIMS:  , ,  ,  ,    CIWA:  CIWA-Ar Total: 4 COWS:     Musculoskeletal: Strength &  Muscle Tone: within normal limits Gait & Station: normal Patient leans: N/A  Psychiatric Specialty Exam:  Presentation  General Appearance:  Appropriate for Environment  Eye Contact: Good  Speech: Normal  Rate  Speech Volume: Normal  Handedness: Right   Mood and Affect  Mood: Anxious; Depressed  Affect: Depressed   Thought Process  Thought Processes: Goal Directed  Descriptions of Associations:Intact  Orientation:Full (Time, Place and Person)  Thought Content:Logical  History of Schizophrenia/Schizoaffective disorder:No  Duration of Psychotic Symptoms:No data recorded Hallucinations:Hallucinations: None  Ideas of Reference:None  Suicidal Thoughts:Suicidal Thoughts: No  Homicidal Thoughts:Homicidal Thoughts: No   Sensorium  Memory: Immediate Good; Recent Good  Judgment: Other (comment) (limited)  Insight: Shallow   Executive Functions  Concentration: Fair  Attention Span: Fair  Recall: Fair  Fund of Knowledge: Good  Language: Good   Psychomotor Activity  Psychomotor Activity: Psychomotor Activity: Normal   Assets  Assets: Communication Skills; Social Support   Sleep  Sleep: Sleep: Poor    Physical Exam: Physical Exam HENT:     Head: Normocephalic.  Eyes:     Extraocular Movements: Extraocular movements intact.  Pulmonary:     Effort: Pulmonary effort is normal.  Musculoskeletal:        General: Normal range of motion.  Skin:    Comments: Bilateral orbit ecchymosis  Neurological:     Mental Status: She is alert.  Psychiatric:        Mood and Affect: Mood is depressed.        Behavior: Behavior normal.    ROS Blood pressure (!) 119/93, pulse 99, temperature 98.5 F (36.9 C), temperature source Oral, resp. rate 16, height 5\' 2"  (1.575 m), weight 69.4 kg, SpO2 100%. Body mass index is 27.98 kg/m.   Treatment Plan Summary: -Completed Effexor 37.5 mg po daily x 1 dose for depression.  -Continue Effexor  75 mg po daily for depression (start tomorrow 06-09-23).  -discontinue mirtazapine at patient request.  -Continue Hydroxyzine 25 mg po Q 6 hrs prn for anxiety/CIWA > 10 x 72 hours.  -Continue Lorazepam 1 mg po Q 6 hrs prn for CIWA > 10 x 72 hrs.    Agitation.  Continue Benadryl 50 mg PO or IM tid prn for agitation.   Skin lesions to right breast;  Continue clobetasol cream, apply topically to affected area of the right nipple bid.   Other medical issues.  -Continue Dulera 2 puff bid for sob.  -Continue Singulair 10 mg po Q bedtime prn for asthma.  -Continue thiamine 100 mg po daily for low thiamine replacement. -Continue thiamine 100 mg IM x once for low thiamine.  -Continue Clobetasol, apply to affected area of the right nipple bid.    Other PRNS -Continue Tylenol 650 mg every 6 hours PRN for mild pain. -Continue Maalox 30 ml Q 4 hrs PRN for indigestion. -Continue MOM 30 ml po Q 6 hrs for constipation.  -Continue Zofran-odt 4 mg Q 36 hrs prn for N/V.  -Continue imodium 2-4 mg po prn for diarrhea.   Safety and Monitoring: Voluntary admission to inpatient psychiatric unit for safety, stabilization and treatment Daily contact with patient to assess and evaluate symptoms and progress in treatment Patient's case to be discussed in multi-disciplinary team meeting Observation Level : q15 minute checks Vital signs: q12 hours Precautions: Safety   Discharge Planning: Social work and case management to assist with discharge planning and identification of hospital follow-up needs prior to discharge Estimated LOS: 5-7 days Discharge Concerns: Need to establish a safety plan; Medication compliance and effectiveness Discharge Goals: Return home with outpatient referrals for mental health follow-up including medication management/psychotherapy  Roselle Locus, MD 06/10/2023, 1:40 PM
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
## 2023-06-10 NOTE — Plan of Care (Signed)
  Problem: Education: Goal: Knowledge of [facility] [place] [facility] Education information/materials will improve Outcome: Progressing Goal: Emotional status will improve Outcome: Progressing Goal: Mental status will improve Outcome: Progressing Goal: Verbalization of understanding the information provided will improve Outcome: Progressing   Problem: Activity: Goal: Interest or engagement in activities will improve Outcome: Progressing Goal: Sleeping patterns will improve Outcome: Progressing   Problem: Coping: Goal: Ability to verbalize frustrations and anger appropriately will improve Outcome: Progressing Goal: Ability to demonstrate self-control will improve Outcome: Progressing   Problem: Health Behavior/Discharge Planning: Goal: Compliance with treatment plan for underlying cause of condition will improve Outcome: Progressing   Problem: Education: Goal: Utilization of techniques to improve thought processes will improve Outcome: Progressing

## 2023-06-10 NOTE — Progress Notes (Signed)
   06/10/23 0157  Psychosocial Assessment  Eye Contact Fair  Facial Expression Anxious  Affect Anxious;Depressed  Speech Logical/coherent  Interaction Assertive  Motor Activity Slow  Appearance/Hygiene Unremarkable  Thought Process  Coherency WDL  Content WDL  Delusions None reported or observed  Perception WDL  Hallucination None reported or observed  Judgment Poor  Confusion WDL  Danger to Self  Current suicidal ideation? Denies  Agreement Not to Harm Self Yes  Description of Agreement verbal  Danger to Others  Danger to Others None reported or observed

## 2023-06-10 NOTE — [facility] Group Notes (Signed)
[facility] Group Notes:  (Nursing/MHT/Case Management/Adjunct)  Date:  06/10/2023  Time:  2000  Type of Therapy:   Wrap up group  Participation Level:  Active  Participation Quality:  Appropriate, Attentive, Sharing, and Supportive  Affect:  Depressed  Cognitive:  Alert  Insight:  Improving  Engagement in Group:  Engaged  Modes of Intervention:  Clarification, Education, and Support  Summary of Progress/Problems: Positive thinking and positive change were discussed.   Lauren Cook 06/10/2023, 8:39 PM

## 2023-06-10 NOTE — Plan of Care (Signed)
Problem: Education: Goal: Knowledge of [facility] Education information/materials will improve Outcome: Progressing   Problem: Activity: Goal: Interest or engagement in activities will improve Outcome: Progressing   Problem: Coping: Goal: Ability to verbalize frustrations and anger appropriately will improve Outcome: Progressing   Problem: Safety: Goal: Periods of time without injury will increase Outcome: Progressing

## 2023-06-11 DIAGNOSIS — F332 Major depressive disorder, recurrent severe without psychotic features: Secondary | ICD-10-CM | POA: Diagnosis not present

## 2023-06-11 NOTE — [facility] Group Notes (Signed)
[facility] Group Notes:  (Nursing/MHT/Case Management/Adjunct)  Date:  06/11/2023  Time:  2000  Type of Therapy:   Wrap up group  Participation Level:  Active  Participation Quality:  Appropriate, Attentive, Sharing, and Supportive  Affect:  Appropriate  Cognitive:  Alert  Insight:  Improving  Engagement in Group:  Engaged  Modes of Intervention:  Clarification, Education, and Socialization  Summary of Progress/Problems: Positive thinking and self-care were discussed.   Lauren Cook 06/11/2023, 8:53 PM

## 2023-06-11 NOTE — Progress Notes (Signed)
Pt's mother would like to be called regarding discharge plan.   06/11/23 2210  Psych Admission Type (Psych Patients Only)  Admission Status Voluntary  Psychosocial Assessment  Patient Complaints None  Eye Contact Fair  Facial Expression Anxious;Animated  Affect Appropriate to circumstance  Speech Logical/coherent  Interaction Assertive  Motor Activity Other (Comment) (WDL)  Appearance/Hygiene Unremarkable  Behavior Characteristics Appropriate to situation  Mood Anxious;Pleasant  Thought Process  Coherency WDL  Content WDL  Delusions None reported or observed  Perception WDL  Hallucination None reported or observed  Judgment WDL  Confusion None  Danger to Self  Current suicidal ideation? Denies  Self-Injurious Behavior No self-injurious ideation or behavior indicators observed or expressed   Agreement Not to Harm Self Yes  Description of Agreement verbal  Danger to Others  Danger to Others None reported or observed

## 2023-06-11 NOTE — Group Note (Signed)
Date:  06/11/2023 Time:  2:42 PM  Group Topic/Focus:  Goals Group:   The focus of this group is to help patients establish daily goals to achieve during treatment and discuss how the patient can incorporate goal setting into their daily lives to aide in recovery.    Participation Level:  Active  Participation Quality:  Appropriate  Affect:  Appropriate  Cognitive:  Appropriate  Insight: Appropriate  Engagement in Group:  Engaged  Modes of Intervention:  Discussion  Additional Comments:     Reymundo Poll 06/11/2023, 2:42 PM

## 2023-06-11 NOTE — Plan of Care (Signed)
  Problem: Education: Goal: Knowledge of [facility] Education information/materials will improve Outcome: Progressing   Problem: Activity: Goal: Interest or engagement in activities will improve Outcome: Progressing   Problem: Coping: Goal: Ability to verbalize frustrations and anger appropriately will improve Outcome: Progressing   Problem: Health Behavior/Discharge Planning: Goal: Identification of resources available to assist in meeting health care needs will improve Outcome: Progressing

## 2023-06-11 NOTE — Progress Notes (Signed)
   06/10/23 2211  Psychosocial Assessment  Patient Complaints Anxiety;Depression  Eye Contact Fair  Facial Expression Anxious  Affect Anxious;Depressed  Speech Logical/coherent  Interaction Assertive  Motor Activity Slow  Appearance/Hygiene Unremarkable  Behavior Characteristics Anxious;Calm  Mood Pleasant  Thought Process  Coherency WDL  Content WDL  Delusions None reported or observed  Perception WDL  Hallucination None reported or observed  Judgment Poor  Confusion WDL  Danger to Self  Current suicidal ideation? Denies  Agreement Not to Harm Self Yes  Description of Agreement verbal  Danger to Others  Danger to Others None reported or observed

## 2023-06-11 NOTE — Group Note (Addendum)
[facility] LCSW Group Therapy Note  Date/Time:  06/11/2023 10:00am-11:00am  Type of Therapy and Topic:  Group Therapy:  Healthy and Unhealthy Coping Skills and Supports  Participation Level:  Active   Description of Group:  Patients in this group explored the differences between healthy and unhealthy, specifically with coping skills and supports, using the worksheet below.  Many examples were provided by CSW and group members.  The emphasis was on providing hope for change.  A demonstration was given about how to set boundaries with family and/or friends, which patients expressed was beneficial.    Therapeutic Goals:   1)  compare healthy versus unhealthy coping skills and healthy versus unhealthy supports 2)  identify examples of each and demonstrate commonalities within the group  3)  generate ideas of healthy coping skills and supports that can be added  4)  discuss importance of adding healthy supports and setting boundaries with unhealthy supports  5)  offer mutual support about how to address unhealthy coping skills and supports  6)  encourage active participation in group   Healthy vs. 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  Fast Usually takes time to develop  Easy Often difficult to learn  Often effortless, can be done without thought Usually takes effort, thinking about it  Usually a habit Usually unknown, has to become a habit  Can do alone Often need to reach out for help   Leads to loss Leads to gain         My Unhealthy Coping Skills                                    My Healthy Coping Skills                       My Unhealthy Supports                                           My Healthy Supports                       Summary of Patient Progress:  The patient expressed herself freely throughout  group even though it was difficult.  She talked about how she has always been opposed to therapy until coming to this hospital.   Therapeutic Modalities:   Psychoeducation Brief Solution-Focused Therapy  Ambrose Mantle, LCSW

## 2023-06-11 NOTE — Plan of Care (Signed)
  Problem: Education: Goal: Emotional status will improve Outcome: Progressing Goal: Mental status will improve Outcome: Progressing Goal: Verbalization of understanding the information provided will improve Outcome: Progressing   Problem: Activity: Goal: Interest or engagement in activities will improve Outcome: Progressing   Problem: Health Behavior/Discharge Planning: Goal: Compliance with treatment plan for underlying cause of condition will improve Outcome: Progressing   Problem: Physical Regulation: Goal: Ability to maintain clinical measurements within normal limits will improve Outcome: Progressing   Problem: Education: Goal: Utilization of techniques to improve thought processes will improve Outcome: Progressing Goal: Knowledge of the prescribed therapeutic regimen will improve Outcome: Progressing   Problem: Activity: Goal: Interest or engagement in leisure activities will improve Outcome: Progressing

## 2023-06-11 NOTE — Group Note (Signed)
Date:  06/11/2023 Time:  4:50 PM  Group Topic/Focus:  Wellness Toolbox:   The focus of this group is to discuss various aspects of wellness, balancing those aspects and exploring ways to increase the ability to experience wellness.  Patients will create a wellness toolbox for use upon discharge.    Participation Level:  Minimal  Participation Quality:  Drowsy  Affect:  Appropriate  Cognitive:  Appropriate  Insight: Appropriate  Engagement in Group:  Lacking  Modes of Intervention:  Exploration  Additional Comments:     Reymundo Poll 06/11/2023, 4:50 PM

## 2023-06-11 NOTE — Progress Notes (Signed)
[facility] MD Progress Note  06/11/2023 1:53 PM Lauren Cook  MRN:  [id number] Subjective:  Lauren Cook was seen this afternoon in the common area. She has been setting some goals for herself. She has been talking to family and they are supportive. She has some that are looking for a therapist. She would like a new psychiatrist, since she has been having a lot of difficulties getting in touch with her previous one. She is sleeping and eating okay. No hallucinations, thoughts of harm to self or others.  Principal Problem: MDD (major depressive disorder), recurrent episode, severe (HCC) Diagnosis: Principal Problem:   MDD (major depressive disorder), recurrent episode, severe (HCC) Active Problems:   GAD (generalized anxiety disorder)   Adjustment disorder with depressed mood  Total Time spent with patient: 20 minutes  Past Psychiatric History: see H&P  Past Medical History:  Past Medical History:  Diagnosis Date   Allergy    GERD (gastroesophageal reflux disease)     Past Surgical History:  Procedure Laterality Date   TONSILLECTOMY Bilateral 10/05/2022   Procedure: TONSILLECTOMY;  Surgeon: Bud Face, MD;  Location: [facility];  Service: ENT;  Laterality: Bilateral;   WISDOM TOOTH EXTRACTION Bilateral 07/2013   Family History:  Family History  Problem Relation Age of Onset   Breast cancer Mother    Family Psychiatric  History: see H&P Social History:  Social History   Substance and Sexual Activity  Alcohol Use Yes   Alcohol/week: 10.0 standard drinks of alcohol   Types: 10 Standard drinks or equivalent per week   Comment: qo weekend 1/2 bottle Tequila     Social History   Substance and Sexual Activity  Drug Use Yes   Types: Marijuana   Comment: last use 07/03/22 edible    Social History   Socioeconomic History   Marital status: Single    Spouse name: Not on file   Number of children: Not on file   Years of education: Not on file   Highest education  level: Not on file  Occupational History   Not on file  Tobacco Use   Smoking status: Some Days    Types: Cigarettes   Smokeless tobacco: Never  Vaping Use   Vaping status: Never Used  Substance and Sexual Activity   Alcohol use: Yes    Alcohol/week: 10.0 standard drinks of alcohol    Types: 10 Standard drinks or equivalent per week    Comment: qo weekend 1/2 bottle Tequila   Drug use: Yes    Types: Marijuana    Comment: last use 07/03/22 edible   Sexual activity: Yes    Partners: Male  Other Topics Concern   Not on file  Social History Narrative   ** Merged History Encounter **       Social Determinants of Health   Financial Resource Strain: Not on file  Food Insecurity: No Food Insecurity (06/08/2023)   Hunger Vital Sign    Worried About Running Out of Food in the Last Year: Never true    Ran Out of Food in the Last Year: Never true  Transportation Needs: No Transportation Needs (06/08/2023)   PRAPARE - Administrator, Civil Service (Medical): No    Lack of Transportation (Non-Medical): No  Physical Activity: Not on file  Stress: Not on file  Social Connections: Not on file   Additional Social History:  Sleep: Fair  Appetite:  Fair  Current Medications: Current Facility-Administered Medications  Medication Dose Route Frequency Provider Last Rate Last Admin   acetaminophen (TYLENOL) tablet 650 mg  650 mg Oral Q6H PRN Rankin, Shuvon B, NP   650 mg at 06/08/23 1151   alum & mag hydroxide-simeth (MAALOX/MYLANTA) 200-200-20 MG/5ML suspension 30 mL  30 mL Oral Q4H PRN Rankin, Shuvon B, NP       clobetasol ointment (TEMOVATE) 0.05 % 1 Application  1 Application Topical BID Rankin, Shuvon B, NP   1 Application at 06/08/23 1708   diphenhydrAMINE (BENADRYL) capsule 50 mg  50 mg Oral TID PRN Rankin, Shuvon B, NP       Or   diphenhydrAMINE (BENADRYL) injection 50 mg  50 mg Intramuscular TID PRN Rankin, Shuvon B, NP        hydrocortisone cream 0.5 % 1 Application  1 Application Topical BID PRN Rankin, Shuvon B, NP       magnesium hydroxide (MILK OF MAGNESIA) suspension 30 mL  30 mL Oral Daily PRN Rankin, Shuvon B, NP       mometasone-formoterol (DULERA) 100-5 MCG/ACT inhaler 2 puff  2 puff Inhalation BID Rankin, Shuvon B, NP   2 puff at 06/07/23 2317   montelukast (SINGULAIR) tablet 10 mg  10 mg Oral QHS PRN Rankin, Shuvon B, NP       thiamine (Vitamin B-1) tablet 100 mg  100 mg Oral Daily Massengill, Nathan, MD   100 mg at 06/11/23 0935   venlafaxine XR (EFFEXOR-XR) 24 hr capsule 75 mg  75 mg Oral Q breakfast Armandina Stammer I, NP   75 mg at 06/11/23 0620    Lab Results: No results found for this or any previous visit (from the past 48 hour(s)).  Blood Alcohol level:  Lab Results  Component Value Date   ETH <10 06/07/2023    Metabolic Disorder Labs: Lab Results  Component Value Date   HGBA1C 5.0 06/07/2023   MPG 97 06/07/2023   Lab Results  Component Value Date   PROLACTIN 10.2 06/07/2023   Lab Results  Component Value Date   CHOL 187 06/07/2023   TRIG 71 06/07/2023   HDL 74 06/07/2023   CHOLHDL 2.5 06/07/2023   VLDL 14 06/07/2023   LDLCALC 99 06/07/2023   LDLCALC 96 05/15/2020    Physical Findings: AIMS:  , ,  ,  ,    CIWA:  CIWA-Ar Total: 2 COWS:     Musculoskeletal: Strength & Muscle Tone: within normal limits Gait & Station: normal Patient leans: N/A  Psychiatric Specialty Exam:  Presentation  General Appearance:  Appropriate for Environment  Eye Contact: Good  Speech: Normal Rate  Speech Volume: Normal  Handedness: Right   Mood and Affect  Mood: Euthymic  Affect: Appropriate   Thought Process  Thought Processes: Coherent; Linear  Descriptions of Associations:Intact  Orientation:Full (Time, Place and Person)  Thought Content:Abstract Reasoning; Logical  History of Schizophrenia/Schizoaffective disorder:No  Duration of Psychotic Symptoms:No data  recorded Hallucinations:Hallucinations: None  Ideas of Reference:None  Suicidal Thoughts:Suicidal Thoughts: No  Homicidal Thoughts:Homicidal Thoughts: No   Sensorium  Memory: Immediate Good; Recent Good; Remote Good  Judgment: Fair  Insight: Fair   Art therapist  Concentration: Good  Attention Span: Good  Recall: Good  Fund of Knowledge: Good  Language: Good   Psychomotor Activity  Psychomotor Activity: Psychomotor Activity: Normal   Assets  Assets: Communication Skills; Desire for Improvement; Social Support; Vocational/Educational; Housing   Sleep  Sleep: Sleep:  Fair    Physical Exam: Physical Exam HENT:     Head: Normocephalic.  Eyes:     Extraocular Movements: Extraocular movements intact.  Pulmonary:     Effort: Pulmonary effort is normal.  Skin:    Findings: Bruising present.  Neurological:     General: No focal deficit present.     Mental Status: She is alert and oriented to person, place, and time.  Psychiatric:        Behavior: Behavior normal.        Thought Content: Thought content normal.    Review of Systems  Musculoskeletal:  Negative for myalgias.  Neurological:  Negative for dizziness and headaches.   Blood pressure (!) 128/94, pulse 94, temperature 98.8 F (37.1 C), temperature source Oral, resp. rate 16, height 5\' 2"  (1.575 m), weight 69.4 kg, SpO2 100%. Body mass index is 27.98 kg/m.   Treatment Plan Summary: Completed Effexor 37.5 mg po daily x 1 dose for depression.  -Continue Effexor 75 mg po daily for depression (start tomorrow 06-09-23).  -discontinue mirtazapine at patient request.  -Continue Hydroxyzine 25 mg po Q 6 hrs prn for anxiety/CIWA > 10 x 72 hours.  -Continue Lorazepam 1 mg po Q 6 hrs prn for CIWA > 10 x 72 hrs.    Agitation.  Continue Benadryl 50 mg PO or IM tid prn for agitation.   Skin lesions to right breast;  Continue clobetasol cream, apply topically to affected area of the right  nipple bid.   Other medical issues.  -Continue Dulera 2 puff bid for sob.  -Continue Singulair 10 mg po Q bedtime prn for asthma.  -Continue thiamine 100 mg po daily for low thiamine replacement. -Continue thiamine 100 mg IM x once for low thiamine.  -Continue Clobetasol, apply to affected area of the right nipple bid.    Other PRNS -Continue Tylenol 650 mg every 6 hours PRN for mild pain. -Continue Maalox 30 ml Q 4 hrs PRN for indigestion. -Continue MOM 30 ml po Q 6 hrs for constipation.  -Continue Zofran-odt 4 mg Q 36 hrs prn for N/V.  -Continue imodium 2-4 mg po prn for diarrhea.   Safety and Monitoring: Voluntary admission to inpatient psychiatric unit for safety, stabilization and treatment Daily contact with patient to assess and evaluate symptoms and progress in treatment Patient's case to be discussed in multi-disciplinary team meeting Observation Level : q15 minute checks Vital signs: q12 hours Precautions: Safety   Discharge Planning: Social work and case management to assist with discharge planning and identification of hospital follow-up needs prior to discharge Estimated LOS: 5-7 days Discharge Concerns: Need to establish a safety plan; Medication compliance and effectiveness Discharge Goals: Return home with outpatient referrals for mental health follow-up including medication management/psychotherapy  Roselle Locus, MD 06/11/2023, 1:53 PM

## 2023-06-11 NOTE — Progress Notes (Signed)
   06/11/23 0900  Psych Admission Type (Psych Patients Only)  Admission Status Voluntary  Psychosocial Assessment  Patient Complaints None  Eye Contact Fair  Facial Expression Anxious;Sad;Wide-eyed  Affect Anxious;Depressed  Speech Logical/coherent  Interaction Assertive  Motor Activity Slow  Appearance/Hygiene Unremarkable  Behavior Characteristics Anxious;Cooperative;Calm  Mood Sad  Thought Process  Coherency WDL  Content WDL  Delusions None reported or observed  Perception WDL  Hallucination None reported or observed  Judgment Poor  Confusion None  Danger to Self  Current suicidal ideation? Denies  Agreement Not to Harm Self Yes  Description of Agreement Verbal  Danger to Others  Danger to Others None reported or observed

## 2023-06-11 NOTE — Group Note (Signed)
Date:  06/11/2023 Time:  11:00 AM  Group Topic/Focus:  Goals Group:   The focus of this group is to help patients establish daily goals to achieve during treatment and discuss how the patient can incorporate goal setting into their daily lives to aide in recovery.    Participation Level:  Active  Participation Quality:  Appropriate  Affect:  Appropriate  Cognitive:  Appropriate  Insight: Appropriate  Engagement in Group:  Engaged  Modes of Intervention:  Discussion  Additional Comments:     Reymundo Poll 06/11/2023, 11:00 AM

## 2023-06-12 DIAGNOSIS — F332 Major depressive disorder, recurrent severe without psychotic features: Secondary | ICD-10-CM | POA: Diagnosis not present

## 2023-06-12 MED ORDER — VENLAFAXINE HCL ER 75 MG PO CP24: 75.0000 mg | ORAL_CAPSULE | Freq: Every day | ORAL | 0 refills | Status: AC

## 2023-06-12 NOTE — Plan of Care (Signed)
  Problem: Education: Goal: Knowledge of [place] [facility] Education information/materials will improve 06/12/2023 1120 by Jovita Gamma, RN Outcome: Completed/Met 06/12/2023 0924 by Jovita Gamma, RN Outcome: Progressing Goal: Emotional status will improve 06/12/2023 1120 by Jovita Gamma, RN Outcome: Completed/Met 06/12/2023 0925 by Jovita Gamma, RN Outcome: Progressing Goal: Mental status will improve 06/12/2023 1120 by Jovita Gamma, RN Outcome: Completed/Met 06/12/2023 0925 by Jovita Gamma, RN Outcome: Progressing Goal: Verbalization of understanding the information provided will improve 06/12/2023 1120 by Jovita Gamma, RN Outcome: Completed/Met 06/12/2023 0925 by Jovita Gamma, RN Outcome: Progressing

## 2023-06-12 NOTE — Plan of Care (Signed)
  Problem: Education: Goal: Knowledge of [place] [facility] Education information/materials will improve Outcome: Progressing Goal: Emotional status will improve Outcome: Progressing Goal: Mental status will improve Outcome: Progressing Goal: Verbalization of understanding the information provided will improve Outcome: Progressing   Problem: Activity: Goal: Interest or engagement in activities will improve Outcome: Progressing   Problem: Coping: Goal: Ability to verbalize frustrations and anger appropriately will improve Outcome: Progressing Goal: Ability to demonstrate self-control will improve Outcome: Progressing
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
## 2023-06-12 NOTE — Progress Notes (Signed)
  [facility] Adult Case Management Discharge Plan :  Will you be returning to the same living situation after discharge:  Yes,  Pt will be returning home with mom. At discharge, do you have transportation home?: Yes,  Pts mom will be picking her up. Do you have the ability to pay for your medications: Yes,  [employer]  Release of information consent forms completed and in the chart;  Patient's signature needed at discharge.  Patient to Follow up at:  Follow-up Information     [facility]. Call.   Why: Please call to cancel this appt, as we may be unable to cancel the appt that was made on your behalf, prior to your discharge:  You have an appointment for medication management services on 07/04/23 at 9:40 am, Virtual. Contact information: [place], [place]  P:  [phone], ext. [phone]        [facility]. Schedule an appointment as soon as possible for a visit.   Why: Please call this provider personally to get an appointment for therapy services (provider request). Contact information: [address] [place], [place] [address] [phone]         [facility] Follow up on 07/03/2023.   Why: You have an appointment for medication management services on 07/03/23 at 3:50 pm. This will be a Virtual appt. Contact information: [address] [place] [address] [phone]                 Next level of care provider has access to [facility] Link:no  Safety Planning and Suicide Prevention discussed: Yes,  [name] ( mom ) [phone]     Has patient been referred to the Quitline?: Patient does not use tobacco/nicotine products  Patient has been referred for addiction treatment: No known substance use disorder.  Izell [place], LCSW 06/12/2023, 9:36 AM

## 2023-06-12 NOTE — Progress Notes (Signed)
   06/12/23 0524  15 Minute Checks  Location Bedroom  Visual Appearance Calm  Behavior Sleeping  Sleep (Behavioral Health Patients Only)  Calculate sleep? (Click Yes once per 24 hr at 0600 safety check) Yes  Documented sleep last 24 hours 6.75

## 2023-06-12 NOTE — Progress Notes (Signed)
Pt discharged to self care, she was anxious to leave, denied SI/HI, plan or intent. Pt mood and affect was appropriate and focused on getting a psychiatrist/change upon discharge. Pt ambulatory to lobby to be transported home with mother and without complaints/concern and without incident

## 2023-06-12 NOTE — Discharge Instructions (Signed)
-  Follow-up with your outpatient psychiatric provider -instructions on appointment date, time, and address (location) are provided to you in discharge paperwork.  -Take your psychiatric medications as prescribed at discharge - instructions are provided to you in the discharge paperwork  -Follow-up with outpatient primary care doctor and other specialists -for management of preventative medicine and any chronic medical disease.  -Recommend abstinence from alcohol, tobacco, and other illicit drug use at discharge.   -If your psychiatric symptoms recur, worsen, or if you have side effects to your psychiatric medications, call your outpatient psychiatric provider, 911, [phone] or go to the nearest emergency department.  -If suicidal thoughts occur, call your outpatient psychiatric provider, 911, 988 or go to the nearest emergency department.  

## 2023-06-12 NOTE — Group Note (Signed)
Date:  06/12/2023 Time:  9:20 AM  Group Topic/Focus:  Goals Group:   The focus of this group is to help patients establish daily goals to achieve during treatment and discuss how the patient can incorporate goal setting into their daily lives to aide in recovery. Orientation:   The focus of this group is to educate the patient on the purpose and policies of crisis stabilization and provide a format to answer questions about their admission.  The group details unit policies and expectations of patients while admitted.    Participation Level:  Active  Participation Quality:  Attentive  Affect:  Appropriate  Cognitive:  Appropriate  Insight: Appropriate  Engagement in Group:  Engaged  Modes of Intervention:  Discussion  Additional Comments: Patient attended goals group and was attentive the duration of it. Patient's goal was to attend all groups.   Georgianna Band T Medford Staheli 06/12/2023, 9:20 AM

## 2023-06-12 NOTE — Plan of Care (Signed)
  Problem: Education: Goal: Knowledge of [place] [facility] Education information/materials will improve Outcome: Progressing   Problem: Education: Goal: Verbalization of understanding the information provided will improve Outcome: Progressing

## 2023-07-16 ENCOUNTER — Inpatient Hospital Stay (HOSPITAL_COMMUNITY): Payer: BC Managed Care – PPO

## 2023-07-16 ENCOUNTER — Inpatient Hospital Stay (HOSPITAL_COMMUNITY)
Admission: EM | Admit: 2023-07-16 | Discharge: 2023-07-21 | DRG: 493 | Disposition: A | Discharge status: Home / Self Care | Payer: BC Managed Care – PPO | Attending: Surgery | Admitting: Surgery

## 2023-07-16 ENCOUNTER — Other Ambulatory Visit: Payer: Self-pay

## 2023-07-16 ENCOUNTER — Emergency Department (HOSPITAL_COMMUNITY): Payer: BC Managed Care – PPO

## 2023-07-16 ENCOUNTER — Other Ambulatory Visit (HOSPITAL_COMMUNITY): Payer: Self-pay | Admitting: Radiology

## 2023-07-16 DIAGNOSIS — S27321A Contusion of lung, unilateral, initial encounter: Secondary | ICD-10-CM | POA: Diagnosis present

## 2023-07-16 DIAGNOSIS — S01512A Laceration without foreign body of oral cavity, initial encounter: Secondary | ICD-10-CM | POA: Diagnosis not present

## 2023-07-16 DIAGNOSIS — Y906 Blood alcohol level of 120-199 mg/100 ml: Secondary | ICD-10-CM | POA: Diagnosis present

## 2023-07-16 DIAGNOSIS — S80211A Abrasion, right knee, initial encounter: Secondary | ICD-10-CM | POA: Diagnosis present

## 2023-07-16 DIAGNOSIS — F4310 Post-traumatic stress disorder, unspecified: Secondary | ICD-10-CM | POA: Diagnosis present

## 2023-07-16 DIAGNOSIS — Z91041 Radiographic dye allergy status: Secondary | ICD-10-CM

## 2023-07-16 DIAGNOSIS — Z87891 Personal history of nicotine dependence: Secondary | ICD-10-CM

## 2023-07-16 DIAGNOSIS — S0181XA Laceration without foreign body of other part of head, initial encounter: Secondary | ICD-10-CM | POA: Diagnosis present

## 2023-07-16 DIAGNOSIS — F909 Attention-deficit hyperactivity disorder, unspecified type: Secondary | ICD-10-CM | POA: Diagnosis present

## 2023-07-16 DIAGNOSIS — Y92410 Unspecified street and highway as the place of occurrence of the external cause: Secondary | ICD-10-CM | POA: Diagnosis not present

## 2023-07-16 DIAGNOSIS — Z79899 Other long term (current) drug therapy: Secondary | ICD-10-CM | POA: Diagnosis not present

## 2023-07-16 DIAGNOSIS — S92111A Displaced fracture of neck of right talus, initial encounter for closed fracture: Principal | ICD-10-CM | POA: Diagnosis present

## 2023-07-16 DIAGNOSIS — F10129 Alcohol abuse with intoxication, unspecified: Secondary | ICD-10-CM | POA: Diagnosis present

## 2023-07-16 DIAGNOSIS — S80212A Abrasion, left knee, initial encounter: Secondary | ICD-10-CM | POA: Diagnosis present

## 2023-07-16 DIAGNOSIS — S9301XA Subluxation of right ankle joint, initial encounter: Secondary | ICD-10-CM | POA: Diagnosis present

## 2023-07-16 DIAGNOSIS — F410 Panic disorder [episodic paroxysmal anxiety] without agoraphobia: Secondary | ICD-10-CM | POA: Diagnosis present

## 2023-07-16 DIAGNOSIS — S27329A Contusion of lung, unspecified, initial encounter: Secondary | ICD-10-CM | POA: Diagnosis present

## 2023-07-16 DIAGNOSIS — S01511A Laceration without foreign body of lip, initial encounter: Secondary | ICD-10-CM | POA: Diagnosis present

## 2023-07-16 DIAGNOSIS — F4323 Adjustment disorder with mixed anxiety and depressed mood: Secondary | ICD-10-CM | POA: Diagnosis present

## 2023-07-16 DIAGNOSIS — D62 Acute posthemorrhagic anemia: Secondary | ICD-10-CM | POA: Diagnosis not present

## 2023-07-16 DIAGNOSIS — S2241XA Multiple fractures of ribs, right side, initial encounter for closed fracture: Secondary | ICD-10-CM | POA: Diagnosis present

## 2023-07-16 DIAGNOSIS — F329 Major depressive disorder, single episode, unspecified: Secondary | ICD-10-CM | POA: Diagnosis present

## 2023-07-16 DIAGNOSIS — C50911 Malignant neoplasm of unspecified site of right female breast: Secondary | ICD-10-CM | POA: Diagnosis present

## 2023-07-16 DIAGNOSIS — Z23 Encounter for immunization: Secondary | ICD-10-CM

## 2023-07-16 DIAGNOSIS — Z7982 Long term (current) use of aspirin: Secondary | ICD-10-CM

## 2023-07-16 DIAGNOSIS — S060X9A Concussion with loss of consciousness of unspecified duration, initial encounter: Secondary | ICD-10-CM | POA: Diagnosis present

## 2023-07-16 DIAGNOSIS — F4321 Adjustment disorder with depressed mood: Secondary | ICD-10-CM | POA: Diagnosis not present

## 2023-07-16 HISTORY — DX: Depression, unspecified: F32.A

## 2023-07-16 HISTORY — DX: Anxiety disorder, unspecified: F41.9

## 2023-07-16 HISTORY — DX: Malignant (primary) neoplasm, unspecified: C80.1

## 2023-07-16 LAB — CBC
HCT: 42 % (ref 36.0–46.0)
HCT: 39.3 % (ref 36.0–46.0)
Hemoglobin: 13.8 g/dL (ref 12.0–15.0)
Hemoglobin: 12.9 g/dL (ref 12.0–15.0)
MCH: 32.8 pg (ref 26.0–34.0)
MCH: 32.7 pg (ref 26.0–34.0)
MCHC: 32.9 g/dL (ref 30.0–36.0)
MCHC: 32.8 g/dL (ref 30.0–36.0)
MCV: 99.8 fL (ref 80.0–100.0)
MCV: 99.5 fL (ref 80.0–100.0)
Platelets: 249 10*3/uL (ref 150–400)
Platelets: 231 10*3/uL (ref 150–400)
RBC: 4.21 MIL/uL (ref 3.87–5.11)
RBC: 3.95 MIL/uL (ref 3.87–5.11)
RDW: 11.9 % (ref 11.5–15.5)
RDW: 12 % (ref 11.5–15.5)
WBC: 5.1 10*3/uL (ref 4.0–10.5)
WBC: 7.8 10*3/uL (ref 4.0–10.5)
nRBC: 0 % (ref 0.0–0.2)
nRBC: 0 % (ref 0.0–0.2)

## 2023-07-16 LAB — I-STAT CHEM 8, ED
BUN: 8 mg/dL (ref 6–20)
Calcium, Ion: 0.97 mmol/L — ABNORMAL LOW (ref 1.15–1.40)
Chloride: 105 mmol/L (ref 98–111)
Creatinine, Ser: 1.1 mg/dL — ABNORMAL HIGH (ref 0.44–1.00)
Glucose, Bld: 122 mg/dL — ABNORMAL HIGH (ref 70–99)
HCT: 44 % (ref 36.0–46.0)
Hemoglobin: 15 g/dL (ref 12.0–15.0)
Potassium: 4.9 mmol/L (ref 3.5–5.1)
Sodium: 139 mmol/L (ref 135–145)
TCO2: 23 mmol/L (ref 22–32)

## 2023-07-16 LAB — BASIC METABOLIC PANEL
Anion gap: 13 (ref 5–15)
BUN: 6 mg/dL (ref 6–20)
CO2: 22 mmol/L (ref 22–32)
Calcium: 8.9 mg/dL (ref 8.9–10.3)
Chloride: 104 mmol/L (ref 98–111)
Creatinine, Ser: 0.66 mg/dL (ref 0.44–1.00)
GFR, Estimated: >60 mL/min (ref >60)
Glucose, Bld: 100 mg/dL — ABNORMAL HIGH (ref 70–99)
Potassium: 3.7 mmol/L (ref 3.5–5.1)
Sodium: 139 mmol/L (ref 135–145)

## 2023-07-16 LAB — COMPREHENSIVE METABOLIC PANEL
ALT: 61 U/L — ABNORMAL HIGH (ref 0–44)
AST: 62 U/L — ABNORMAL HIGH (ref 15–41)
Albumin: 4 g/dL (ref 3.5–5.0)
Alkaline Phosphatase: 44 U/L (ref 38–126)
Anion gap: 14 (ref 5–15)
BUN: 7 mg/dL (ref 6–20)
CO2: 22 mmol/L (ref 22–32)
Calcium: 8.7 mg/dL — ABNORMAL LOW (ref 8.9–10.3)
Chloride: 105 mmol/L (ref 98–111)
Creatinine, Ser: 0.71 mg/dL (ref 0.44–1.00)
GFR, Estimated: >60 mL/min (ref >60)
Glucose, Bld: 126 mg/dL — ABNORMAL HIGH (ref 70–99)
Potassium: 3.4 mmol/L — ABNORMAL LOW (ref 3.5–5.1)
Sodium: 141 mmol/L (ref 135–145)
Total Bilirubin: 0.4 mg/dL (ref 0.3–1.2)
Total Protein: 7.2 g/dL (ref 6.5–8.1)

## 2023-07-16 LAB — HIV ANTIBODY (ROUTINE TESTING W REFLEX): HIV Screen 4th Generation wRfx: NONREACTIVE

## 2023-07-16 LAB — I-STAT CG4 LACTIC ACID, ED: Lactic Acid, Venous: 3.7 mmol/L (ref 0.5–1.9)

## 2023-07-16 LAB — SAMPLE TO BLOOD BANK

## 2023-07-16 LAB — ETHANOL: Alcohol, Ethyl (B): 188 mg/dL — ABNORMAL HIGH (ref <10)

## 2023-07-16 LAB — PROTIME-INR
INR: 1.3 — ABNORMAL HIGH (ref 0.8–1.2)
Prothrombin Time: 16.1 s — ABNORMAL HIGH (ref 11.4–15.2)

## 2023-07-16 MED ORDER — ENOXAPARIN SODIUM 30 MG/0.3ML IJ SOSY
30.0000 mg | PREFILLED_SYRINGE | Freq: Two times a day (BID) | INTRAMUSCULAR | Status: DC
Start: 2023-07-17 — End: 2023-07-21
  Administered 2023-07-17 – 2023-07-21 (×8): 30 mg via SUBCUTANEOUS
  Filled 2023-07-16 (×8): qty 0.3

## 2023-07-16 MED ORDER — ONDANSETRON HCL 4 MG/2ML IJ SOLN
4.0000 mg | Freq: Four times a day (QID) | INTRAMUSCULAR | Status: DC | PRN
  Administered 2023-07-16 (×3): 4 mg via INTRAVENOUS
  Filled 2023-07-16 (×3): qty 2

## 2023-07-16 MED ORDER — HYDRALAZINE HCL 20 MG/ML IJ SOLN: 10.0000 mg | INTRAMUSCULAR | Status: DC | PRN

## 2023-07-16 MED ORDER — ACETAMINOPHEN 500 MG PO TABS
1000.0000 mg | ORAL_TABLET | Freq: Four times a day (QID) | ORAL | Status: DC
  Filled 2023-07-16: qty 2

## 2023-07-16 MED ORDER — IOHEXOL 350 MG/ML SOLN
75.0000 mL | Freq: Once | INTRAVENOUS | Status: AC | PRN
  Administered 2023-07-16: 75 mL via INTRAVENOUS

## 2023-07-16 MED ORDER — OXYCODONE HCL 5 MG/5ML PO SOLN
10.0000 mg | ORAL | Status: DC | PRN
  Administered 2023-07-16 – 2023-07-19 (×2): 10 mg via ORAL
  Filled 2023-07-16 (×4): qty 10

## 2023-07-16 MED ORDER — DOCUSATE SODIUM 100 MG PO CAPS: 100.0000 mg | ORAL_CAPSULE | Freq: Two times a day (BID) | ORAL | Status: DC

## 2023-07-16 MED ORDER — OXYCODONE HCL 5 MG PO TABS: 10.0000 mg | ORAL_TABLET | ORAL | Status: DC | PRN

## 2023-07-16 MED ORDER — OXYCODONE HCL 5 MG/5ML PO SOLN
5.0000 mg | ORAL | Status: DC | PRN
  Administered 2023-07-18: 5 mg via ORAL
  Filled 2023-07-16: qty 5

## 2023-07-16 MED ORDER — ONDANSETRON 4 MG PO TBDP
4.0000 mg | ORAL_TABLET | Freq: Four times a day (QID) | ORAL | Status: DC | PRN
  Filled 2023-07-16: qty 1

## 2023-07-16 MED ORDER — OXYCODONE HCL 5 MG PO TABS: 5.0000 mg | ORAL_TABLET | ORAL | Status: DC | PRN

## 2023-07-16 MED ORDER — DEXTROSE-SODIUM CHLORIDE 5-0.45 % IV SOLN: INTRAVENOUS | Status: AC

## 2023-07-16 MED ORDER — MORPHINE SULFATE (PF) 4 MG/ML IV SOLN
4.0000 mg | INTRAVENOUS | Status: DC | PRN
  Administered 2023-07-16 – 2023-07-17 (×5): 4 mg via INTRAVENOUS
  Filled 2023-07-16 (×5): qty 1

## 2023-07-16 MED ORDER — TETANUS-DIPHTH-ACELL PERTUSSIS 5-2.5-18.5 LF-MCG/0.5 IM SUSY
0.5000 mL | PREFILLED_SYRINGE | Freq: Once | INTRAMUSCULAR | Status: AC
  Administered 2023-07-16: 0.5 mL via INTRAMUSCULAR
  Filled 2023-07-16: qty 0.5

## 2023-07-16 MED ORDER — ACETAMINOPHEN 160 MG/5ML PO SOLN
1000.0000 mg | Freq: Four times a day (QID) | ORAL | Status: DC
  Administered 2023-07-16 – 2023-07-21 (×15): 1000 mg via ORAL
  Filled 2023-07-16 (×17): qty 40.6

## 2023-07-16 MED ORDER — METHOCARBAMOL 500 MG PO TABS
500.0000 mg | ORAL_TABLET | Freq: Three times a day (TID) | ORAL | Status: DC
  Filled 2023-07-16: qty 1

## 2023-07-16 MED ORDER — METHOCARBAMOL 1000 MG/10ML IJ SOLN
500.0000 mg | Freq: Three times a day (TID) | INTRAMUSCULAR | Status: DC
  Administered 2023-07-16 – 2023-07-17 (×4): 500 mg via INTRAVENOUS
  Filled 2023-07-16 (×4): qty 10

## 2023-07-16 MED ORDER — POLYETHYLENE GLYCOL 3350 17 G PO PACK: 17.0000 g | PACK | Freq: Every day | ORAL | Status: DC | PRN

## 2023-07-16 MED ORDER — METOPROLOL TARTRATE 5 MG/5ML IV SOLN
5.0000 mg | Freq: Four times a day (QID) | INTRAVENOUS | Status: DC | PRN
  Filled 2023-07-16: qty 5
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
## 2023-07-16 NOTE — ED Notes (Signed)
Transported to CT 

## 2023-07-16 NOTE — ED Notes (Addendum)
Pt reporting continued pain in right ankle. Area is swollen, bruised. Xray result reports possible talus fx. Ace wrap applied and ice placed on area. Janee Morn MD made aware.

## 2023-07-16 NOTE — ED Notes (Signed)
Pt awake at this time, reporting severe oral/facial pain 10/10 as well as ankle pain. Drooling noted with mouth trauma. Patient has not been able to manage oral medications since injury. IV morphine and zofran administered at this time.
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
## 2023-07-16 NOTE — ED Notes (Signed)
Spoke with pts mother [name] [phone], she will come to ED.
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
## 2023-07-16 NOTE — ED Notes (Signed)
FAST: negative 
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
## 2023-07-16 NOTE — ED Notes (Signed)
Pt sleeping at this time. RN to assess further when awaken. Mother at bedside updated on plan of care and does not report any concerns at this time. Vital signs stable.

## 2023-07-17 ENCOUNTER — Encounter (HOSPITAL_COMMUNITY): Payer: Self-pay | Admitting: General Surgery

## 2023-07-17 ENCOUNTER — Inpatient Hospital Stay (HOSPITAL_COMMUNITY): Payer: BC Managed Care – PPO

## 2023-07-17 MED ORDER — METOPROLOL TARTRATE 5 MG/5ML IV SOLN
5.0000 mg | Freq: Four times a day (QID) | INTRAVENOUS | Status: DC | PRN
  Administered 2023-07-18 – 2023-07-19 (×2): 5 mg via INTRAVENOUS
  Filled 2023-07-17 (×2): qty 5

## 2023-07-17 MED ORDER — KETOROLAC TROMETHAMINE 15 MG/ML IJ SOLN
30.0000 mg | Freq: Four times a day (QID) | INTRAMUSCULAR | Status: DC
  Administered 2023-07-17 – 2023-07-21 (×16): 30 mg via INTRAVENOUS
  Filled 2023-07-17 (×16): qty 2

## 2023-07-17 MED ORDER — METHOCARBAMOL 500 MG PO TABS
1000.0000 mg | ORAL_TABLET | Freq: Three times a day (TID) | ORAL | Status: DC
  Administered 2023-07-17 – 2023-07-20 (×5): 1000 mg via ORAL
  Filled 2023-07-17 (×6): qty 2

## 2023-07-17 MED ORDER — VENLAFAXINE HCL ER 75 MG PO CP24
75.0000 mg | ORAL_CAPSULE | Freq: Every morning | ORAL | Status: DC
  Administered 2023-07-17: 75 mg via ORAL
  Filled 2023-07-17: qty 1

## 2023-07-17 MED ORDER — CLONAZEPAM 0.5 MG PO TABS: 2.0000 mg | ORAL_TABLET | Freq: Two times a day (BID) | ORAL | Status: DC | PRN

## 2023-07-17 MED ORDER — DOCUSATE SODIUM 50 MG/5ML PO LIQD
100.0000 mg | Freq: Two times a day (BID) | ORAL | Status: DC
  Administered 2023-07-17: 100 mg via ORAL
  Filled 2023-07-17 (×6): qty 10

## 2023-07-17 MED ORDER — CEPHALEXIN 250 MG/5ML PO SUSR
500.0000 mg | Freq: Four times a day (QID) | ORAL | Status: AC
  Administered 2023-07-17 – 2023-07-18 (×7): 500 mg via ORAL
  Filled 2023-07-17 (×7): qty 10

## 2023-07-17 MED ORDER — METHOCARBAMOL 1000 MG/10ML IJ SOLN
500.0000 mg | Freq: Three times a day (TID) | INTRAMUSCULAR | Status: DC
  Administered 2023-07-17 – 2023-07-21 (×7): 500 mg via INTRAVENOUS
  Filled 2023-07-17 (×8): qty 10

## 2023-07-17 MED ORDER — CLONAZEPAM 0.5 MG PO TABS
1.0000 mg | ORAL_TABLET | Freq: Two times a day (BID) | ORAL | Status: DC | PRN
  Administered 2023-07-18: 1 mg via ORAL
  Filled 2023-07-17 (×2): qty 2

## 2023-07-17 MED ORDER — MIRTAZAPINE 15 MG PO TABS
7.5000 mg | ORAL_TABLET | Freq: Every day | ORAL | Status: DC
  Administered 2023-07-17 – 2023-07-20 (×4): 7.5 mg via ORAL
  Filled 2023-07-17 (×4): qty 1

## 2023-07-17 MED ORDER — AMPHETAMINE-DEXTROAMPHETAMINE 10 MG PO TABS
10.0000 mg | ORAL_TABLET | Freq: Two times a day (BID) | ORAL | Status: DC
  Administered 2023-07-17 – 2023-07-21 (×4): 10 mg via ORAL
  Filled 2023-07-17 (×4): qty 1

## 2023-07-17 MED ORDER — MORPHINE SULFATE (PF) 4 MG/ML IV SOLN: 4.0000 mg | INTRAVENOUS | Status: DC | PRN

## 2023-07-17 MED ORDER — ENSURE ENLIVE PO LIQD
237.0000 mL | Freq: Two times a day (BID) | ORAL | Status: DC
  Administered 2023-07-17 – 2023-07-21 (×7): 237 mL via ORAL

## 2023-07-17 MED ORDER — ALPRAZOLAM 0.5 MG PO TABS
2.0000 mg | ORAL_TABLET | Freq: Every day | ORAL | Status: DC
  Administered 2023-07-17: 2 mg via ORAL
  Filled 2023-07-17: qty 4
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
## 2023-07-17 NOTE — Progress Notes (Signed)
Orthopedic Tech Progress Note Patient Details:  Lauren Cook 2001-08-24 [id number]  Ortho Devices Type of Ortho Device: Short leg splint and stirrup Ortho Device/Splint Location: RLE Ortho Device/Splint Interventions: Ordered, Application   Post Interventions Patient Tolerated: Well  Ranjit Ashurst OTR/L 07/17/2023, 6:20 PM
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
## 2023-07-17 NOTE — Progress Notes (Signed)
Transition of Care [facility]) - CAGE-AID Screening   Patient Details  Name: Lauren Cook MRN: [id number] Date of Birth: [birth year]  Transition of Care [facility]) CM/SW Contact:    Ladoris Gene, RN Phone Number: 07/17/2023, 11:17 AM   Clinical Narrative:    CAGE-AID Screening:    Have You Ever Felt You Ought to Cut Down on Your Drinking or Drug Use?: No Have People Annoyed You By [employer] Your Drinking Or Drug Use?: No Have You Felt Bad Or Guilty About Your Drinking Or Drug Use?: No Have You Ever Had a Drink or Used Drugs First Thing In The Morning to Steady Your Nerves or to Get Rid of a Hangover?: No CAGE-AID Score: 0
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
## 2023-07-17 NOTE — Progress Notes (Signed)
Patient refused urine pregnancy test,she state that she is 100 % sure that she is not pregnant, pt heart rate reached to 140s,vitals checked,12 lead EKG done,pt remains asymptomatic her heart rate dropped slowly to 110s ,PA notified

## 2023-07-17 NOTE — Progress Notes (Signed)
PT Cancellation Note  Patient Details Name: Lauren Cook MRN: [id number] DOB: 2001/01/19   Cancelled Treatment:    Reason Eval/Treat Not Completed: Other (comment). Spoke with [name], trauma PA, who stated they are waiting on clarification on if patient has a talus fracture and she may be going for imaging to verify. Acute PT to return as able once new mobility orders/weight bearing status are put in.  Lewis Shock, PT, DPT Acute Rehabilitation Services Secure chat preferred Office #: [phone]    [name] 07/17/2023, 8:32 AM

## 2023-07-18 DIAGNOSIS — F4321 Adjustment disorder with depressed mood: Secondary | ICD-10-CM

## 2023-07-18 DIAGNOSIS — F4310 Post-traumatic stress disorder, unspecified: Secondary | ICD-10-CM | POA: Diagnosis not present

## 2023-07-18 DIAGNOSIS — F909 Attention-deficit hyperactivity disorder, unspecified type: Secondary | ICD-10-CM | POA: Diagnosis not present

## 2023-07-18 LAB — URINALYSIS, ROUTINE W REFLEX MICROSCOPIC
Glucose, UA: NEGATIVE mg/dL
Hgb urine dipstick: NEGATIVE
Ketones, ur: 5 mg/dL — AB
Leukocytes,Ua: NEGATIVE
Nitrite: NEGATIVE
Protein, ur: 100 mg/dL — AB
Specific Gravity, Urine: >1.046 — ABNORMAL HIGH (ref 1.005–1.030)
pH: 5 (ref 5.0–8.0)

## 2023-07-18 LAB — PREGNANCY, URINE: Preg Test, Ur: NEGATIVE

## 2023-07-18 MED ORDER — TRANEXAMIC ACID-NACL 1000-0.7 MG/100ML-% IV SOLN
1000.0000 mg | INTRAVENOUS | Status: AC
Start: 2023-07-19 — End: 2023-07-20
  Administered 2023-07-19: 1000 mg via INTRAVENOUS
  Filled 2023-07-18: qty 100

## 2023-07-18 MED ORDER — CHLORHEXIDINE GLUCONATE 4 % EX SOLN
60.0000 mL | Freq: Once | CUTANEOUS | Status: AC
  Administered 2023-07-19: 4 via TOPICAL
  Filled 2023-07-18: qty 15

## 2023-07-18 MED ORDER — SERTRALINE HCL 50 MG PO TABS
50.0000 mg | ORAL_TABLET | Freq: Every day | ORAL | Status: DC
  Administered 2023-07-18 – 2023-07-21 (×4): 50 mg via ORAL
  Filled 2023-07-18 (×4): qty 1

## 2023-07-18 MED ORDER — POVIDONE-IODINE 10 % EX SWAB: 2.0000 | Freq: Once | CUTANEOUS | Status: DC

## 2023-07-18 MED ORDER — CEFAZOLIN SODIUM-DEXTROSE 2-4 GM/100ML-% IV SOLN
2.0000 g | INTRAVENOUS | Status: AC
Start: 2023-07-19 — End: 2023-07-19
  Administered 2023-07-19: 2 g via INTRAVENOUS
  Filled 2023-07-18: qty 100

## 2023-07-18 MED ORDER — LACTATED RINGERS IV BOLUS
500.0000 mL | Freq: Once | INTRAVENOUS | Status: AC
  Administered 2023-07-18: 500 mL via INTRAVENOUS
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
## 2023-07-18 NOTE — Progress Notes (Signed)
   Inpatient Rehab Admissions Coordinator :  Per therapy recommendations patient was screened for CIR candidacy by Sir Mallis RN MSN. Patient is not yet at a level to tolerate the intensity required to pursue a CIR admit . The CIR admissions team will follow and monitor for progress and place a Rehab Consult order if felt to be appropriate. Please contact me with any questions.  Shery Wauneka RN MSN Admissions Coordinator [phone]  
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
## 2023-07-19 ENCOUNTER — Encounter (HOSPITAL_COMMUNITY): Admission: EM | Disposition: A | Discharge status: Home / Self Care | Payer: Self-pay | Source: Home / Self Care

## 2023-07-19 ENCOUNTER — Encounter (HOSPITAL_COMMUNITY): Payer: Self-pay | Admitting: General Surgery

## 2023-07-19 ENCOUNTER — Other Ambulatory Visit: Payer: Self-pay

## 2023-07-19 ENCOUNTER — Inpatient Hospital Stay (HOSPITAL_COMMUNITY): Payer: BC Managed Care – PPO

## 2023-07-19 HISTORY — PX: ORIF ANKLE FRACTURE: SHX5408

## 2023-07-19 LAB — CBC
HCT: 32.1 % — ABNORMAL LOW (ref 36.0–46.0)
Hemoglobin: 10.6 g/dL — ABNORMAL LOW (ref 12.0–15.0)
MCH: 32.7 pg (ref 26.0–34.0)
MCHC: 33 g/dL (ref 30.0–36.0)
MCV: 99.1 fL (ref 80.0–100.0)
Platelets: 205 10*3/uL (ref 150–400)
RBC: 3.24 MIL/uL — ABNORMAL LOW (ref 3.87–5.11)
RDW: 11.9 % (ref 11.5–15.5)
WBC: 4.4 10*3/uL (ref 4.0–10.5)
nRBC: 0 % (ref 0.0–0.2)

## 2023-07-19 LAB — BASIC METABOLIC PANEL
Anion gap: 8 (ref 5–15)
BUN: 15 mg/dL (ref 6–20)
CO2: 26 mmol/L (ref 22–32)
Calcium: 8.5 mg/dL — ABNORMAL LOW (ref 8.9–10.3)
Chloride: 102 mmol/L (ref 98–111)
Creatinine, Ser: 0.68 mg/dL (ref 0.44–1.00)
GFR, Estimated: >60 mL/min (ref >60)
Glucose, Bld: 87 mg/dL (ref 70–99)
Potassium: 3.2 mmol/L — ABNORMAL LOW (ref 3.5–5.1)
Sodium: 136 mmol/L (ref 135–145)

## 2023-07-19 LAB — MAGNESIUM: Magnesium: 2.1 mg/dL (ref 1.7–2.4)

## 2023-07-19 SURGERY — OPEN REDUCTION INTERNAL FIXATION (ORIF) ANKLE FRACTURE
Anesthesia: General | Site: Ankle | Laterality: Right

## 2023-07-19 MED ORDER — SODIUM CHLORIDE 0.9 % IV SOLN
INTRAVENOUS | Status: DC | PRN
Start: 2023-07-19 — End: 2023-07-19

## 2023-07-19 MED ORDER — MIDAZOLAM HCL 2 MG/2ML IJ SOLN
INTRAMUSCULAR | Status: DC | PRN
  Administered 2023-07-19: 2 mg via INTRAVENOUS

## 2023-07-19 MED ORDER — DEXAMETHASONE SODIUM PHOSPHATE 10 MG/ML IJ SOLN
INTRAMUSCULAR | Status: AC
  Filled 2023-07-19: qty 1

## 2023-07-19 MED ORDER — FENTANYL CITRATE (PF) 250 MCG/5ML IJ SOLN
INTRAMUSCULAR | Status: AC
  Filled 2023-07-19: qty 5

## 2023-07-19 MED ORDER — ONDANSETRON HCL 4 MG/2ML IJ SOLN
INTRAMUSCULAR | Status: DC | PRN
  Administered 2023-07-19 (×2): 4 mg via INTRAVENOUS

## 2023-07-19 MED ORDER — CHLORHEXIDINE GLUCONATE 0.12 % MT SOLN: 15.0000 mL | Freq: Once | OROMUCOSAL | Status: DC

## 2023-07-19 MED ORDER — FENTANYL CITRATE (PF) 250 MCG/5ML IJ SOLN
INTRAMUSCULAR | Status: DC | PRN
  Administered 2023-07-19: 100 ug via INTRAVENOUS
  Administered 2023-07-19 (×3): 50 ug via INTRAVENOUS

## 2023-07-19 MED ORDER — KETAMINE HCL 10 MG/ML IJ SOLN
INTRAMUSCULAR | Status: DC | PRN
  Administered 2023-07-19: 50 mg via INTRAVENOUS

## 2023-07-19 MED ORDER — POTASSIUM CHLORIDE 20 MEQ PO PACK
20.0000 meq | PACK | Freq: Two times a day (BID) | ORAL | Status: AC
  Administered 2023-07-19 (×2): 20 meq via ORAL
  Filled 2023-07-19 (×2): qty 1

## 2023-07-19 MED ORDER — LACTATED RINGERS IV SOLN: INTRAVENOUS | Status: DC

## 2023-07-19 MED ORDER — DEXMEDETOMIDINE HCL IN NACL 80 MCG/20ML IV SOLN
INTRAVENOUS | Status: DC | PRN
  Administered 2023-07-19: 8 ug via INTRAVENOUS

## 2023-07-19 MED ORDER — OXYCODONE HCL 5 MG PO TABS: 5.0000 mg | ORAL_TABLET | Freq: Once | ORAL | Status: DC | PRN

## 2023-07-19 MED ORDER — HYDROMORPHONE HCL 1 MG/ML IJ SOLN
0.2500 mg | INTRAMUSCULAR | Status: DC | PRN
  Administered 2023-07-19 (×2): 0.5 mg via INTRAVENOUS

## 2023-07-19 MED ORDER — LIDOCAINE 2% (20 MG/ML) 5 ML SYRINGE
INTRAMUSCULAR | Status: DC | PRN
  Administered 2023-07-19: 100 mg via INTRAVENOUS

## 2023-07-19 MED ORDER — CEFAZOLIN SODIUM-DEXTROSE 2-4 GM/100ML-% IV SOLN
2.0000 g | Freq: Three times a day (TID) | INTRAVENOUS | Status: AC
  Administered 2023-07-19 – 2023-07-20 (×3): 2 g via INTRAVENOUS
  Filled 2023-07-19 (×3): qty 100

## 2023-07-19 MED ORDER — PHENYLEPHRINE 80 MCG/ML (10ML) SYRINGE FOR IV PUSH (FOR BLOOD PRESSURE SUPPORT)
PREFILLED_SYRINGE | INTRAVENOUS | Status: DC | PRN
  Administered 2023-07-19 (×2): 160 ug via INTRAVENOUS
  Administered 2023-07-19: 80 ug via INTRAVENOUS

## 2023-07-19 MED ORDER — ONDANSETRON HCL 4 MG/2ML IJ SOLN
INTRAMUSCULAR | Status: AC
  Filled 2023-07-19: qty 2

## 2023-07-19 MED ORDER — KETAMINE HCL 50 MG/5ML IJ SOSY
PREFILLED_SYRINGE | INTRAMUSCULAR | Status: AC
  Filled 2023-07-19: qty 5

## 2023-07-19 MED ORDER — ORAL CARE MOUTH RINSE: 15.0000 mL | Freq: Once | OROMUCOSAL | Status: DC

## 2023-07-19 MED ORDER — MIDAZOLAM HCL 2 MG/2ML IJ SOLN
INTRAMUSCULAR | Status: AC
  Filled 2023-07-19: qty 2

## 2023-07-19 MED ORDER — OXYCODONE HCL 5 MG/5ML PO SOLN: 5.0000 mg | Freq: Once | ORAL | Status: DC | PRN

## 2023-07-19 MED ORDER — ONDANSETRON HCL 4 MG/2ML IJ SOLN: 4.0000 mg | Freq: Once | INTRAMUSCULAR | Status: DC | PRN

## 2023-07-19 MED ORDER — LIDOCAINE 2% (20 MG/ML) 5 ML SYRINGE
INTRAMUSCULAR | Status: AC
  Filled 2023-07-19: qty 5

## 2023-07-19 MED ORDER — PROPOFOL 10 MG/ML IV BOLUS
INTRAVENOUS | Status: DC | PRN
  Administered 2023-07-19: 200 mg via INTRAVENOUS

## 2023-07-19 MED ORDER — HYDROMORPHONE HCL 1 MG/ML IJ SOLN
INTRAMUSCULAR | Status: AC
  Filled 2023-07-19: qty 1

## 2023-07-19 MED ORDER — 0.9 % SODIUM CHLORIDE (POUR BTL) OPTIME
TOPICAL | Status: DC | PRN
  Administered 2023-07-19: 1000 mL

## 2023-07-19 MED ORDER — VANCOMYCIN HCL 1000 MG IV SOLR
INTRAVENOUS | Status: DC | PRN
  Administered 2023-07-19: 1000 mg via TOPICAL

## 2023-07-19 MED ORDER — VANCOMYCIN HCL 1000 MG IV SOLR
INTRAVENOUS | Status: AC
  Filled 2023-07-19: qty 20

## 2023-07-19 MED ORDER — FENTANYL CITRATE (PF) 100 MCG/2ML IJ SOLN
INTRAMUSCULAR | Status: AC
  Filled 2023-07-19: qty 2

## 2023-07-19 MED ORDER — DEXAMETHASONE SODIUM PHOSPHATE 10 MG/ML IJ SOLN
INTRAMUSCULAR | Status: DC | PRN
  Administered 2023-07-19: 10 mg via INTRAVENOUS

## 2023-07-19 SURGICAL SUPPLY — 81 items
APL PRP STRL LF DISP 70% ISPRP (MISCELLANEOUS) ×1
BAG COUNTER SPONGE SURGICOUNT (BAG) ×2 IMPLANT
BAG SPNG CNTER NS LX DISP (BAG) ×1
BANDAGE ESMARK 6X9 LF (GAUZE/BANDAGES/DRESSINGS) ×2 IMPLANT
BIT DRILL 3.0 6IN (DRILL) IMPLANT
BIT DRILL LONG 1.5 ZI (BIT) IMPLANT
BIT DRILL SHORT 2.0 ZI (BIT) IMPLANT
BNDG CMPR 5X4 KNIT ELC UNQ LF (GAUZE/BANDAGES/DRESSINGS) ×1
BNDG CMPR 6 X 5 YARDS HK CLSR (GAUZE/BANDAGES/DRESSINGS) ×1
BNDG CMPR 9X6 STRL LF SNTH (GAUZE/BANDAGES/DRESSINGS)
BNDG COHESIVE 4X5 TAN STRL (GAUZE/BANDAGES/DRESSINGS) ×2 IMPLANT
BNDG ELASTIC 4INX 5YD STR LF (GAUZE/BANDAGES/DRESSINGS) IMPLANT
BNDG ELASTIC 4X5.8 VLCR STR LF (GAUZE/BANDAGES/DRESSINGS) IMPLANT
BNDG ELASTIC 6INX 5YD STR LF (GAUZE/BANDAGES/DRESSINGS) IMPLANT
BNDG ELASTIC 6X5.8 VLCR STR LF (GAUZE/BANDAGES/DRESSINGS) IMPLANT
BNDG ESMARK 6X9 LF (GAUZE/BANDAGES/DRESSINGS) IMPLANT
BRUSH SCRUB EZ PLAIN DRY (MISCELLANEOUS) ×4 IMPLANT
CHLORAPREP W/TINT 26 (MISCELLANEOUS) ×2 IMPLANT
COVER SURGICAL LIGHT HANDLE (MISCELLANEOUS) ×2 IMPLANT
DRAPE C-ARM 42X72 X-RAY (DRAPES) ×2 IMPLANT
DRAPE C-ARMOR (DRAPES) ×2 IMPLANT
DRAPE ORTHO SPLIT 77X108 STRL (DRAPES) ×2
DRAPE SURG ORHT 6 SPLT 77X108 (DRAPES) ×4 IMPLANT
DRAPE U-SHAPE 47X51 STRL (DRAPES) ×2 IMPLANT
DRILL 3.0 6IN (DRILL) ×1 IMPLANT
DRIVER RETENTION T6 ZI (ORTHOPEDIC DISPOSABLE SUPPLIES) IMPLANT
DRSG ADAPTIC 3X8 NADH LF (GAUZE/BANDAGES/DRESSINGS) IMPLANT
DRSG MEPITEL 3X4 ME34 (GAUZE/BANDAGES/DRESSINGS) IMPLANT
ELECT REM PT RETURN 9FT ADLT (ELECTROSURGICAL) ×1 IMPLANT
ELECTRODE REM PT RTRN 9FT ADLT (ELECTROSURGICAL) ×2 IMPLANT
GAUZE PAD ABD 8X10 STRL (GAUZE/BANDAGES/DRESSINGS) IMPLANT
GAUZE SPONGE 4X4 12PLY STRL (GAUZE/BANDAGES/DRESSINGS) IMPLANT
GLOVE BIO SURGEON STRL SZ 6.5 (GLOVE) ×6 IMPLANT
GLOVE BIO SURGEON STRL SZ7.5 (GLOVE) ×6 IMPLANT
GLOVE BIOGEL PI IND STRL 6.5 (GLOVE) ×2 IMPLANT
GLOVE BIOGEL PI IND STRL 7.5 (GLOVE) ×2 IMPLANT
GOWN STRL REUS W/ TWL LRG LVL3 (GOWN DISPOSABLE) ×4 IMPLANT
GOWN STRL REUS W/TWL LRG LVL3 (GOWN DISPOSABLE) ×2
GUIDEWIRE 1.6 6IN (WIRE) IMPLANT
K-WIRE ALPS MXV 1.1X6 ZI (WIRE) ×1 IMPLANT
K-WIRE ALPS MXV 1.6X6 ZI (WIRE) ×1 IMPLANT
K-WIRE OLIVE PLT TACK 2.0-4 SU (WIRE) ×1 IMPLANT
KIT TURNOVER KIT B (KITS) ×2 IMPLANT
KWIRE ALPS MXV 1.1X6 ZI (WIRE) IMPLANT
KWIRE ALPS MXV 1.6X6 ZI (WIRE) IMPLANT
KWIRE OLIVE PLT TACK 2.0-4 SU (WIRE) IMPLANT
MANIFOLD NEPTUNE II (INSTRUMENTS) ×2 IMPLANT
NDL HYPO 21X1.5 SAFETY (NEEDLE) IMPLANT
NDL HYPO 25GX1X1/2 BEV (NEEDLE) ×2 IMPLANT
NEEDLE HYPO 21X1.5 SAFETY (NEEDLE) IMPLANT
NEEDLE HYPO 25GX1X1/2 BEV (NEEDLE) IMPLANT
NS IRRIG 1000ML POUR BTL (IV SOLUTION) ×2 IMPLANT
PACK TOTAL JOINT (CUSTOM PROCEDURE TRAY) ×2 IMPLANT
PAD ARMBOARD 7.5X6 YLW CONV (MISCELLANEOUS) ×4 IMPLANT
PAD CAST 4YDX4 CTTN HI CHSV (CAST SUPPLIES) IMPLANT
PADDING CAST COTTON 4X4 STRL (CAST SUPPLIES) ×1
PADDING CAST COTTON 6X4 STRL (CAST SUPPLIES) IMPLANT
PLATE 1.5X2 3H ALPS T 5H (Plate) IMPLANT
SCREW CANN SHT HDLS 4X40 (Screw) IMPLANT
SCREW LOCK MDS 2X16 (Screw) IMPLANT
SCREW LOCK MDS 2X18 (Screw) IMPLANT
SCREW NL 2.0X20 (Screw) IMPLANT
SCREW NL 2.0X22 (Screw) IMPLANT
SCREW NLOCK 2X24 (Screw) IMPLANT
SCREW NLOCK 2X26 (Screw) IMPLANT
SPLINT PLASTER CAST FAST 5X30 (CAST SUPPLIES) IMPLANT
SPONGE T-LAP 18X18 ~~LOC~~+RFID (SPONGE) IMPLANT
STAPLER VISISTAT 35W (STAPLE) ×2 IMPLANT
SUCTION TUBE FRAZIER 10FR DISP (SUCTIONS) ×2 IMPLANT
SUT ETHILON 3 0 PS 1 (SUTURE) ×4 IMPLANT
SUT MON AB 2-0 CT1 36 (SUTURE) IMPLANT
SUT PROLENE 0 CT (SUTURE) IMPLANT
SUT VIC AB 0 CT1 27 (SUTURE) ×1
SUT VIC AB 0 CT1 27XBRD ANBCTR (SUTURE) ×2 IMPLANT
SUT VIC AB 2-0 CT1 27 (SUTURE)
SUT VIC AB 2-0 CT1 TAPERPNT 27 (SUTURE) ×4 IMPLANT
SYR CONTROL 10ML LL (SYRINGE) ×2 IMPLANT
TOWEL GREEN STERILE (TOWEL DISPOSABLE) ×4 IMPLANT
TOWEL GREEN STERILE FF (TOWEL DISPOSABLE) ×2 IMPLANT
UNDERPAD 30X36 HEAVY ABSORB (UNDERPADS AND DIAPERS) ×2 IMPLANT
WATER STERILE IRR 1000ML POUR (IV SOLUTION) ×2 IMPLANT
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
## 2023-07-19 NOTE — Progress Notes (Signed)
Patient arrived from PACU.  Patient asleep.  VSS.  No complaints from patient or family.    07/19/23 1349  Vitals  Temp 97.7 F (36.5 C)  Temp Source Oral  BP Location Right Arm  BP Method Automatic  Patient Position (if appropriate) Lying  Pulse Rate Source Monitor  Level of Consciousness  Level of Consciousness Alert  Oxygen Therapy  O2 Device Room Air  Pain Assessment  Pain Scale 0-10  Pain Score Asleep

## 2023-07-19 NOTE — Plan of Care (Signed)
  Problem: Activity: Goal: Risk for activity intolerance will decrease Outcome: Progressing   Problem: Nutrition: Goal: Adequate nutrition will be maintained Outcome: Progressing   Problem: Coping: Goal: Level of anxiety will decrease Outcome: Progressing   Problem: Pain Managment: Goal: General experience of comfort will improve Outcome: Progressing   

## 2023-07-19 NOTE — Transfer of Care (Signed)
Immediate Anesthesia Transfer of Care Note  Patient: Lauren Cook  Procedure(s) Performed: OPEN REDUCTION INTERNAL FIXATION (ORIF) TALUS FRACTURE (Right: Ankle)  Patient Location: PACU  Anesthesia Type:General  Level of Consciousness: awake, alert , and oriented  Airway & Oxygen Therapy: Patient Spontanous Breathing  Post-op Assessment: Report given to RN and Post -op Vital signs reviewed and stable  Post vital signs: Reviewed and stable  Last Vitals:  Vitals Value Taken Time  BP 117/84 07/19/23 1247  Temp    Pulse 78 07/19/23 1253  Resp 11 07/19/23 1253  SpO2 100 % 07/19/23 1253  Vitals shown include unfiled device data.  Last Pain:  Vitals:   07/19/23 1012  TempSrc:   PainSc: 10-Worst pain ever      Patients Stated Pain Goal: 0 (07/19/23 1012)  Complications: No notable events documented.
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
## 2023-07-19 NOTE — Anesthesia Preprocedure Evaluation (Signed)
Anesthesia Evaluation  Patient identified by MRN, date of birth, ID band Patient awake    Reviewed: Allergy & Precautions, H&P , NPO status , Patient's Chart, lab work & pertinent test results  Airway Mallampati: II  TM Distance: >3 FB Neck ROM: Full    Dental no notable dental hx.    Pulmonary neg pulmonary ROS, former smoker   Pulmonary exam normal breath sounds clear to auscultation       Cardiovascular negative cardio ROS Normal cardiovascular exam Rhythm:Regular Rate:Normal     Neuro/Psych negative neurological ROS  negative psych ROS   GI/Hepatic negative GI ROS, Neg liver ROS,,,  Endo/Other  negative endocrine ROS    Renal/GU negative Renal ROS  negative genitourinary   Musculoskeletal negative musculoskeletal ROS (+)    Abdominal   Peds negative pediatric ROS (+)  Hematology negative hematology ROS (+)   Anesthesia Other Findings   Reproductive/Obstetrics negative OB ROS                             Anesthesia Physical Anesthesia Plan  ASA: 2  Anesthesia Plan: General   Post-op Pain Management: Ketamine IV*   Induction: Intravenous  PONV Risk Score and Plan: 3 and Ondansetron, Dexamethasone and Treatment may vary due to age or medical condition  Airway Management Planned: LMA  Additional Equipment:   Intra-op Plan:   Post-operative Plan: Extubation in OR  Informed Consent: I have reviewed the patients History and Physical, chart, labs and discussed the procedure including the risks, benefits and alternatives for the proposed anesthesia with the patient or authorized representative who has indicated his/her understanding and acceptance.     Dental advisory given  Plan Discussed with: CRNA and Surgeon  Anesthesia Plan Comments: (BAC 0.188 on 10/20)       Anesthesia Quick Evaluation

## 2023-07-19 NOTE — Anesthesia Postprocedure Evaluation (Signed)
Anesthesia Post Note  Patient: Lauren Cook  Procedure(s) Performed: OPEN REDUCTION INTERNAL FIXATION (ORIF) TALUS FRACTURE (Right: Ankle)     Patient location during evaluation: PACU Anesthesia Type: General Level of consciousness: awake and alert Pain management: pain level controlled Vital Signs Assessment: post-procedure vital signs reviewed and stable Respiratory status: spontaneous breathing, nonlabored ventilation, respiratory function stable and patient connected to nasal cannula oxygen Cardiovascular status: blood pressure returned to baseline and stable Postop Assessment: no apparent nausea or vomiting Anesthetic complications: no  No notable events documented.  Last Vitals:  Vitals:   07/19/23 1315 07/19/23 1330  BP: 117/85 125/86  Pulse: 69 68  Resp: 12 11  Temp:  36.5 C  SpO2: 97% 96%    Last Pain:  Vitals:   07/19/23 1315  TempSrc:   PainSc: Asleep                 Brandii Lakey S

## 2023-07-19 NOTE — Plan of Care (Signed)

## 2023-07-19 NOTE — Anesthesia Procedure Notes (Signed)
Procedure Name: LMA Insertion Date/Time: 07/19/2023 10:46 AM  Performed by: Camillia Herter, CRNAPre-anesthesia Checklist: Patient identified, Emergency Drugs available, Suction available and Patient being monitored Patient Re-evaluated:Patient Re-evaluated prior to induction Oxygen Delivery Method: Circle System Utilized Preoxygenation: Pre-oxygenation with 100% oxygen Induction Type: IV induction Ventilation: Mask ventilation without difficulty LMA: LMA inserted LMA Size: 4.0 Number of attempts: 1 Placement Confirmation: positive ETCO2 Tube secured with: Tape Dental Injury: Teeth and Oropharynx as per pre-operative assessment

## 2023-07-20 LAB — CBC
HCT: 30.1 % — ABNORMAL LOW (ref 36.0–46.0)
Hemoglobin: 10.1 g/dL — ABNORMAL LOW (ref 12.0–15.0)
MCH: 33.2 pg (ref 26.0–34.0)
MCHC: 33.6 g/dL (ref 30.0–36.0)
MCV: 99 fL (ref 80.0–100.0)
Platelets: 226 10*3/uL (ref 150–400)
RBC: 3.04 MIL/uL — ABNORMAL LOW (ref 3.87–5.11)
RDW: 11.8 % (ref 11.5–15.5)
WBC: 8 10*3/uL (ref 4.0–10.5)
nRBC: 0 % (ref 0.0–0.2)

## 2023-07-20 LAB — BASIC METABOLIC PANEL
Anion gap: 7 (ref 5–15)
BUN: 14 mg/dL (ref 6–20)
CO2: 23 mmol/L (ref 22–32)
Calcium: 8.4 mg/dL — ABNORMAL LOW (ref 8.9–10.3)
Chloride: 106 mmol/L (ref 98–111)
Creatinine, Ser: 0.61 mg/dL (ref 0.44–1.00)
GFR, Estimated: >60 mL/min (ref >60)
Glucose, Bld: 95 mg/dL (ref 70–99)
Potassium: 4.3 mmol/L (ref 3.5–5.1)
Sodium: 136 mmol/L (ref 135–145)

## 2023-07-20 LAB — VITAMIN D 25 HYDROXY (VIT D DEFICIENCY, FRACTURES): Vit D, 25-Hydroxy: 20.2 ng/mL — ABNORMAL LOW (ref 30–100)

## 2023-07-20 MED ORDER — OXYCODONE HCL 5 MG/5ML PO SOLN: 2.5000 mg | ORAL | Status: DC | PRN

## 2023-07-20 MED ORDER — GABAPENTIN 250 MG/5ML PO SOLN
100.0000 mg | Freq: Three times a day (TID) | ORAL | Status: DC
  Administered 2023-07-20 – 2023-07-21 (×3): 100 mg via ORAL
  Filled 2023-07-20 (×4): qty 2

## 2023-07-20 MED ORDER — CALCIUM CARBONATE ANTACID 1250 MG/5ML PO SUSP
500.0000 mg | Freq: Two times a day (BID) | ORAL | Status: AC
  Administered 2023-07-20 (×2): 500 mg via ORAL
  Filled 2023-07-20 (×2): qty 5

## 2023-07-20 MED ORDER — LITHIUM CARBONATE 150 MG PO CAPS
150.0000 mg | ORAL_CAPSULE | Freq: Two times a day (BID) | ORAL | Status: DC
  Administered 2023-07-20 – 2023-07-21 (×2): 150 mg via ORAL
  Filled 2023-07-20 (×3): qty 1

## 2023-07-20 NOTE — Progress Notes (Signed)
Orthopaedic Trauma Progress Note  SUBJECTIVE: Doing ok today, having difficulty with pain control in the right ankle but also not wanting to take Pain medications because they make her sleepy.  Denies any numbness or tingling throughout the right lower extremity.  Worked with therapies this morning, notes this went okay.  No chest pain. No SOB. No nausea/vomiting. No other complaints.  No family at bedside currently  OBJECTIVE:  Vitals:   07/20/23 0420 07/20/23 0902  BP: 116/85 (!) 140/78  Pulse: 90 97  Resp: 20 20  Temp: 98.1 F (36.7 C) 98 F (36.7 C)  SpO2: 98% 99%    General: Resting in bed comfortably, no acute distress Respiratory: No increased work of breathing.  Right lower extremity: Well-padded, well-fitting splint in place.  Nontender above splint.  Toes warm and well-perfused.  Endorses sensation to light touch over all toes.  EHL/FHL intact.  Motor and sensory exam to the remainder of the foot and ankle limited secondary to splint placement.  IMAGING: Stable post op imaging.   LABS:  Results for orders placed or performed during the hospital encounter of 07/16/23 (from the past 24 hour(s))  Magnesium     Status: None   Collection Time: 07/19/23  3:36 PM  Result Value Ref Range   Magnesium 2.1 1.7 - 2.4 mg/dL  CBC     Status: Abnormal   Collection Time: 07/20/23  4:13 AM  Result Value Ref Range   WBC 8.0 4.0 - 10.5 K/uL   RBC 3.04 (L) 3.87 - 5.11 MIL/uL   Hemoglobin 10.1 (L) 12.0 - 15.0 g/dL   HCT 40.9 (L) 81.1 - 91.4 %   MCV 99.0 80.0 - 100.0 fL   MCH 33.2 26.0 - 34.0 pg   MCHC 33.6 30.0 - 36.0 g/dL   RDW 78.2 95.6 - 21.3 %   Platelets 226 150 - 400 K/uL   nRBC 0.0 0.0 - 0.2 %  Basic metabolic panel     Status: Abnormal   Collection Time: 07/20/23  4:13 AM  Result Value Ref Range   Sodium 136 135 - 145 mmol/L   Potassium 4.3 3.5 - 5.1 mmol/L   Chloride 106 98 - 111 mmol/L   CO2 23 22 - 32 mmol/L   Glucose, Bld 95 70 - 99 mg/dL   BUN 14 6 - 20 mg/dL    Creatinine, Ser 0.86 0.44 - 1.00 mg/dL   Calcium 8.4 (L) 8.9 - 10.3 mg/dL   GFR, Estimated >57 >84 mL/min   Anion gap 7 5 - 15    ASSESSMENT: Lauren Cook is a 22 y.o. female, 1 Day Post-Op s/p OPEN REDUCTION INTERNAL FIXATION RIGHT TALUS FRACTURE  CV/Blood loss: Acute blood loss anemia, Hgb 10.1 this morning. Hemodynamically stable  PLAN: Weightbearing: NWB RLE ROM: Maintain splint.  Okay for knee motion Incisional and dressing care: Dressings left intact until follow-up  Showering: Okay to shower, keep splint covered and dry Orthopedic device(s): Splint RLE Pain management:  1. Tylenol 1000 mg q 6 hours  2. Robaxin 500 mg q 6 hours PRN 3. Oxycodone 2.5-5 mg q 4 hours PRN 4. Morphine 4 mg q 4 hours PRN 5. Neurontin 100 mg q 8 hours Toradol 30 mg q 6 hours x 5 days VTE prophylaxis: Lovenox, SCDs ID:  Ancef 2gm post op Foley/Lines:  No foley, KVO IVFs Impediments to Fracture Healing: Vitamin D level pending, will start supplementation as indicated Dispo: PT/OT evaluation today, currently recommending home health therapies.  Okay  for discharge from ortho standpoint once cleared by medicine team and therapies  D/C recommendations: -Oxycodone, Robaxin, Neurontin for pain control -Aspirin 325 mg daily x 30 days for DVT prophylaxis -Possible need for Vit D supplementation  Follow - up plan: 2 weeks after discharge for wound check and repeat x-rays   Contact information:  Truitt Merle MD, Thyra Breed PA-C. After hours and holidays please check [email or web address] for group call information for [facility]   Thompson Caul, PA-C [phone] (office) [email or web address]
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
## 2023-07-20 NOTE — Care Management Important Message (Signed)
Important Message  Patient Details  Name: Lauren Cook MRN: [id number] Date of Birth: [birth year]   Important Message Given:  Yes - Medicare IM     Sherilyn Banker 07/20/2023, 11:52 AM

## 2023-07-20 NOTE — Plan of Care (Signed)
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
## 2023-07-21 ENCOUNTER — Other Ambulatory Visit (HOSPITAL_COMMUNITY): Payer: Self-pay

## 2023-07-21 ENCOUNTER — Encounter (HOSPITAL_COMMUNITY): Payer: Self-pay | Admitting: Student

## 2023-07-21 DIAGNOSIS — S060X9A Concussion with loss of consciousness of unspecified duration, initial encounter: Secondary | ICD-10-CM

## 2023-07-21 DIAGNOSIS — F4323 Adjustment disorder with mixed anxiety and depressed mood: Secondary | ICD-10-CM | POA: Diagnosis not present

## 2023-07-21 MED ORDER — OXYCODONE HCL 5 MG/5ML PO SOLN
2.5000 mg | ORAL | 0 refills | Status: AC | PRN
  Filled 2023-07-21: qty 210, 7d supply, fill #0

## 2023-07-21 MED ORDER — ACETAMINOPHEN 160 MG/5ML PO SOLN: 1000.0000 mg | Freq: Four times a day (QID) | ORAL | Status: AC | PRN

## 2023-07-21 MED ORDER — VITAMIN D (ERGOCALCIFEROL) 1.25 MG (50000 UNIT) PO CAPS
50000.0000 [IU] | ORAL_CAPSULE | ORAL | 0 refills | Status: AC
  Filled 2023-07-21: qty 5, 35d supply, fill #0

## 2023-07-21 MED ORDER — SERTRALINE HCL 50 MG PO TABS
50.0000 mg | ORAL_TABLET | Freq: Every day | ORAL | 0 refills | Status: DC
  Filled 2023-07-21: qty 30, 30d supply, fill #0

## 2023-07-21 MED ORDER — ASPIRIN 325 MG PO TBEC
325.0000 mg | DELAYED_RELEASE_TABLET | Freq: Every day | ORAL | 0 refills | Status: AC
  Filled 2023-07-21: qty 30, 30d supply, fill #0

## 2023-07-21 MED ORDER — ONDANSETRON 4 MG PO TBDP
4.0000 mg | ORAL_TABLET | Freq: Four times a day (QID) | ORAL | 0 refills | Status: AC | PRN
  Filled 2023-07-21: qty 20, 30d supply, fill #0

## 2023-07-21 MED ORDER — GABAPENTIN 250 MG/5ML PO SOLN
100.0000 mg | Freq: Three times a day (TID) | ORAL | 0 refills | Status: AC
  Filled 2023-07-21: qty 100, 17d supply, fill #0

## 2023-07-21 MED ORDER — LITHIUM CARBONATE 150 MG PO CAPS
150.0000 mg | ORAL_CAPSULE | Freq: Two times a day (BID) | ORAL | 0 refills | Status: AC
  Filled 2023-07-21: qty 60, 30d supply, fill #0

## 2023-07-21 MED ORDER — METHOCARBAMOL 500 MG PO TABS
1000.0000 mg | ORAL_TABLET | Freq: Three times a day (TID) | ORAL | 0 refills | Status: AC | PRN
  Filled 2023-07-21: qty 60, 10d supply, fill #0

## 2023-07-21 MED ORDER — IBUPROFEN 100 MG/5ML PO SUSP
400.0000 mg | Freq: Four times a day (QID) | ORAL | 0 refills | Status: AC | PRN
  Filled 2023-07-21: qty 480, 6d supply, fill #0

## 2023-07-21 MED ORDER — CLONAZEPAM 0.5 MG PO TABS
2.0000 mg | ORAL_TABLET | Freq: Once | ORAL | Status: DC | PRN
  Filled 2023-07-21: qty 4

## 2023-07-21 MED ORDER — CLONAZEPAM 2 MG PO TABS: 1.0000 mg | ORAL_TABLET | Freq: Two times a day (BID) | ORAL | Status: DC | PRN
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
## 2023-07-21 NOTE — TOC Progression Note (Signed)
Transition of Care [facility]) - Progression Note    Patient Details  Name: Lauren Cook MRN: [id number] Date of Birth: [birth year]  Transition of Care [facility]) CM/SW Contact  Leone Haven, RN Phone Number: 07/21/2023, 11:41 AM  Clinical Narrative:    Patient is for dc today, NCM went to room got insurance card and read off to [facility] pharmacy. They are checking for medication coverage.     Expected Discharge Plan: OP Rehab Barriers to Discharge: Continued Medical Work up  Expected Discharge Plan and Services   Discharge Planning Services: CM Consult   Living arrangements for the past 2 months: Single Family Home Expected Discharge Date: 07/21/23               DME Arranged: [facility] Deep bench, Walker rolling   Date DME Agency Contacted: 07/20/23 Time DME Agency Contacted: 1346 Representative spoke with at DME Agency: Mickeal Needy             Social Determinants of Health (SDOH) Interventions SDOH Screenings   Tobacco Use: Medium Risk (07/19/2023)    Readmission Risk Interventions     No data to display

## 2023-07-21 NOTE — TOC Transition Note (Signed)
Transition of Care [facility]) - CM/SW Discharge Note   Patient Details  Name: Lauren Cook MRN: [id number] Date of Birth: 2001-04-02  [facility] CM/SW Contact:  Leone Haven, RN Phone Number: 07/21/2023, 12:56 PM   Clinical Narrative:    [facility] pharmacy was able to fill the meds for this patient, after getting information from [facility].  They will bring meds to room.   Final next level of care: OP Rehab Barriers to Discharge: Continued Medical Work up   Patient Goals and CMS Choice      Discharge Placement                         Discharge Plan and Services Additional resources added to the After Visit Summary for     Discharge Planning Services: CM Consult            DME Arranged: Tub bench, Walker rolling   Date DME Agency Contacted: 07/20/23 Time DME Agency Contacted: 1346 Representative spoke with at DME Agency: Mickeal Needy            Social Determinants of Health (SDOH) Interventions SDOH Screenings   Tobacco Use: Medium Risk (07/19/2023)     Readmission Risk Interventions     No data to display
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
## 2023-07-24 ENCOUNTER — Encounter (HOSPITAL_COMMUNITY): Payer: Self-pay | Admitting: Registered Nurse

## 2023-08-03 ENCOUNTER — Encounter (INDEPENDENT_AMBULATORY_CARE_PROVIDER_SITE_OTHER): Payer: Self-pay | Admitting: Otolaryngology

## 2023-08-03 ENCOUNTER — Ambulatory Visit (INDEPENDENT_AMBULATORY_CARE_PROVIDER_SITE_OTHER): Payer: BC Managed Care – PPO | Admitting: Otolaryngology

## 2023-08-03 VITALS — BP 118/74 | HR 104 | Ht 62.0 in

## 2023-08-03 DIAGNOSIS — S0993XD Unspecified injury of face, subsequent encounter: Secondary | ICD-10-CM

## 2023-08-03 DIAGNOSIS — S01512D Laceration without foreign body of oral cavity, subsequent encounter: Secondary | ICD-10-CM

## 2023-08-03 DIAGNOSIS — S0181XD Laceration without foreign body of other part of head, subsequent encounter: Secondary | ICD-10-CM
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
## 2023-08-11 ENCOUNTER — Encounter (INDEPENDENT_AMBULATORY_CARE_PROVIDER_SITE_OTHER): Payer: Self-pay

## 2023-08-15 ENCOUNTER — Ambulatory Visit: Payer: BC Managed Care – PPO | Attending: Student | Admitting: Physical Therapy

## 2023-08-15 DIAGNOSIS — M25571 Pain in right ankle and joints of right foot: Secondary | ICD-10-CM | POA: Diagnosis present
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
## 2023-08-16 NOTE — Addendum Note (Signed)
Addended by: Johnn Hai on: 08/16/2023 08:31 AM   Modules accepted: Orders

## 2023-08-16 NOTE — Addendum Note (Signed)
Addended by: Johnn Hai on: 08/16/2023 08:29 AM   Modules accepted: Orders

## 2023-08-17 ENCOUNTER — Ambulatory Visit: Payer: BC Managed Care – PPO | Admitting: Physical Therapy

## 2023-08-17 DIAGNOSIS — M25571 Pain in right ankle and joints of right foot: Secondary | ICD-10-CM
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
## 2023-08-21 ENCOUNTER — Ambulatory Visit: Payer: BC Managed Care – PPO | Admitting: Physical Therapy

## 2023-08-23 ENCOUNTER — Ambulatory Visit: Payer: BC Managed Care – PPO | Admitting: Physical Therapy

## 2023-08-28 ENCOUNTER — Ambulatory Visit: Payer: BC Managed Care – PPO | Attending: Student | Admitting: Physical Therapy

## 2023-08-28 DIAGNOSIS — M25571 Pain in right ankle and joints of right foot: Secondary | ICD-10-CM | POA: Diagnosis present
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
## 2023-08-30 ENCOUNTER — Ambulatory Visit: Payer: BC Managed Care – PPO | Admitting: Physical Therapy

## 2023-09-04 ENCOUNTER — Ambulatory Visit: Payer: BC Managed Care – PPO | Admitting: Physical Therapy

## 2023-09-04 DIAGNOSIS — M25571 Pain in right ankle and joints of right foot: Secondary | ICD-10-CM | POA: Diagnosis not present
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
## 2023-09-06 ENCOUNTER — Ambulatory Visit: Payer: BC Managed Care – PPO | Admitting: Physical Therapy

## 2023-09-06 DIAGNOSIS — M25571 Pain in right ankle and joints of right foot: Secondary | ICD-10-CM | POA: Diagnosis not present
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
## 2023-09-12 ENCOUNTER — Encounter: Payer: Self-pay | Admitting: Physical Therapy

## 2023-09-12 ENCOUNTER — Ambulatory Visit: Payer: BC Managed Care – PPO | Admitting: Physical Therapy

## 2023-09-12 DIAGNOSIS — M25571 Pain in right ankle and joints of right foot: Secondary | ICD-10-CM | POA: Diagnosis not present
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
## 2023-09-14 ENCOUNTER — Ambulatory Visit: Payer: BC Managed Care – PPO | Admitting: Physical Therapy

## 2023-09-14 ENCOUNTER — Encounter: Payer: BC Managed Care – PPO | Admitting: Physical Therapy

## 2023-09-18 ENCOUNTER — Ambulatory Visit: Payer: BC Managed Care – PPO | Admitting: Physical Therapy

## 2023-09-26 ENCOUNTER — Ambulatory Visit: Payer: BC Managed Care – PPO | Admitting: Physical Therapy

## 2023-09-26 DIAGNOSIS — M25571 Pain in right ankle and joints of right foot: Secondary | ICD-10-CM | POA: Diagnosis not present
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
## 2023-09-28 ENCOUNTER — Ambulatory Visit: Payer: BC Managed Care – PPO | Attending: Student | Admitting: Physical Therapy

## 2023-09-28 DIAGNOSIS — M25571 Pain in right ankle and joints of right foot: Secondary | ICD-10-CM | POA: Diagnosis present
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
## 2023-10-02 ENCOUNTER — Encounter: Payer: BC Managed Care – PPO | Admitting: Physical Therapy

## 2023-10-03 ENCOUNTER — Ambulatory Visit: Payer: BC Managed Care – PPO | Admitting: Physical Therapy

## 2023-10-03 ENCOUNTER — Telehealth: Payer: Self-pay | Admitting: Physical Therapy

## 2023-10-03 NOTE — Telephone Encounter (Signed)
 Called pt to instruct her not to come to physical therapy on account of recent RSV diagnosis and her still being in contagious period of virus.

## 2023-10-04 ENCOUNTER — Ambulatory Visit: Payer: BC Managed Care – PPO | Admitting: Physical Therapy

## 2023-10-05 ENCOUNTER — Ambulatory Visit: Payer: BC Managed Care – PPO | Admitting: Physical Therapy

## 2023-10-09 ENCOUNTER — Ambulatory Visit: Payer: BC Managed Care – PPO | Admitting: Physical Therapy

## 2023-10-09 DIAGNOSIS — M25571 Pain in right ankle and joints of right foot: Secondary | ICD-10-CM
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
## 2023-10-11 ENCOUNTER — Ambulatory Visit: Payer: BC Managed Care – PPO | Admitting: Physical Therapy

## 2023-10-11 DIAGNOSIS — M25571 Pain in right ankle and joints of right foot: Secondary | ICD-10-CM
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
## 2023-10-17 ENCOUNTER — Ambulatory Visit: Payer: BC Managed Care – PPO | Admitting: Physical Therapy

## 2023-11-07 ENCOUNTER — Encounter: Payer: BC Managed Care – PPO | Admitting: Physical Therapy

## 2023-11-07 ENCOUNTER — Ambulatory Visit: Payer: BC Managed Care – PPO | Attending: Student | Admitting: Physical Therapy

## 2023-11-07 DIAGNOSIS — M25571 Pain in right ankle and joints of right foot: Secondary | ICD-10-CM | POA: Insufficient documentation

## 2023-11-07 NOTE — Addendum Note (Signed)
Addended by: Johnn Hai on: 11/07/2023 01:37 PM   Modules accepted: Orders
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
## 2023-11-10 ENCOUNTER — Other Ambulatory Visit: Payer: Self-pay

## 2023-11-28 ENCOUNTER — Ambulatory Visit: Payer: BC Managed Care – PPO | Admitting: Physical Therapy

## 2023-11-28 ENCOUNTER — Ambulatory Visit: Payer: BC Managed Care – PPO | Attending: Student | Admitting: Physical Therapy

## 2023-11-28 DIAGNOSIS — M25571 Pain in right ankle and joints of right foot: Secondary | ICD-10-CM | POA: Insufficient documentation
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
## 2023-11-30 ENCOUNTER — Encounter: Payer: BC Managed Care – PPO | Admitting: Physical Therapy

## 2023-12-05 ENCOUNTER — Ambulatory Visit: Payer: BC Managed Care – PPO | Admitting: Physical Therapy

## 2023-12-07 ENCOUNTER — Encounter: Payer: BC Managed Care – PPO | Admitting: Physical Therapy
